# Patient Record
Sex: Male | Born: 1985 | Race: Black or African American | Hispanic: No | Marital: Single | Smoking: Never smoker
Health system: Southern US, Community
[De-identification: ages and names within clinical notes are randomized; demographics above are authoritative.]

## PROBLEM LIST (undated history)

## (undated) DIAGNOSIS — R569 Unspecified convulsions: Secondary | ICD-10-CM

## (undated) HISTORY — PX: DENTAL SURGERY: SHX609

---
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
## 2004-04-01 ENCOUNTER — Ambulatory Visit (HOSPITAL_COMMUNITY): Admission: RE | Admit: 2004-04-01 | Discharge: 2004-04-01 | Payer: Self-pay | Admitting: Pulmonary Disease

## 2005-09-19 ENCOUNTER — Ambulatory Visit (HOSPITAL_COMMUNITY): Admission: RE | Admit: 2005-09-19 | Discharge: 2005-09-19 | Payer: Self-pay | Admitting: Pulmonary Disease

## 2007-01-19 ENCOUNTER — Emergency Department (HOSPITAL_COMMUNITY): Admission: EM | Admit: 2007-01-19 | Discharge: 2007-01-19 | Payer: Self-pay | Admitting: Emergency Medicine

## 2008-01-26 ENCOUNTER — Emergency Department (HOSPITAL_COMMUNITY): Admission: EM | Admit: 2008-01-26 | Discharge: 2008-01-26 | Payer: Self-pay | Admitting: Emergency Medicine

## 2008-11-26 ENCOUNTER — Emergency Department (HOSPITAL_COMMUNITY): Admission: EM | Admit: 2008-11-26 | Discharge: 2008-11-26 | Payer: Self-pay | Admitting: Emergency Medicine

## 2008-12-18 ENCOUNTER — Emergency Department (HOSPITAL_COMMUNITY): Admission: EM | Admit: 2008-12-18 | Discharge: 2008-12-18 | Payer: Self-pay | Admitting: Emergency Medicine

## 2009-05-10 ENCOUNTER — Emergency Department (HOSPITAL_COMMUNITY): Admission: EM | Admit: 2009-05-10 | Discharge: 2009-05-10 | Payer: Self-pay | Admitting: Emergency Medicine

## 2009-05-30 ENCOUNTER — Emergency Department (HOSPITAL_COMMUNITY): Admission: EM | Admit: 2009-05-30 | Discharge: 2009-05-30 | Payer: Self-pay | Admitting: Emergency Medicine

## 2009-10-13 ENCOUNTER — Emergency Department (HOSPITAL_COMMUNITY): Admission: EM | Admit: 2009-10-13 | Discharge: 2009-10-13 | Payer: Self-pay | Admitting: Emergency Medicine

## 2009-11-21 ENCOUNTER — Emergency Department (HOSPITAL_COMMUNITY): Admission: EM | Admit: 2009-11-21 | Discharge: 2009-11-21 | Payer: Self-pay | Admitting: Emergency Medicine

## 2009-12-16 ENCOUNTER — Emergency Department (HOSPITAL_COMMUNITY): Admission: EM | Admit: 2009-12-16 | Discharge: 2009-12-16 | Payer: Self-pay | Admitting: Emergency Medicine

## 2010-01-01 ENCOUNTER — Emergency Department (HOSPITAL_COMMUNITY): Admission: EM | Admit: 2010-01-01 | Discharge: 2010-01-01 | Payer: Self-pay | Admitting: Emergency Medicine

## 2010-06-03 ENCOUNTER — Emergency Department (HOSPITAL_COMMUNITY): Admission: EM | Admit: 2010-06-03 | Discharge: 2010-06-03 | Payer: Self-pay | Admitting: Emergency Medicine

## 2010-09-17 ENCOUNTER — Emergency Department (HOSPITAL_COMMUNITY): Admission: EM | Admit: 2010-09-17 | Discharge: 2010-09-17 | Payer: Self-pay | Admitting: Emergency Medicine

## 2011-03-14 LAB — URINALYSIS, ROUTINE W REFLEX MICROSCOPIC
Glucose, UA: NEGATIVE mg/dL
Hgb urine dipstick: NEGATIVE
Ketones, ur: NEGATIVE mg/dL
pH: 6 (ref 5.0–8.0)

## 2011-03-14 LAB — COMPREHENSIVE METABOLIC PANEL
ALT: 23 U/L (ref 0–53)
Albumin: 4.4 g/dL (ref 3.5–5.2)
Alkaline Phosphatase: 101 U/L (ref 39–117)
Glucose, Bld: 96 mg/dL (ref 70–99)
Potassium: 3.8 mEq/L (ref 3.5–5.1)
Sodium: 138 mEq/L (ref 135–145)
Total Protein: 7.4 g/dL (ref 6.0–8.3)

## 2011-03-14 LAB — DIFFERENTIAL
Eosinophils Absolute: 0.1 10*3/uL (ref 0.0–0.7)
Eosinophils Relative: 1 % (ref 0–5)
Lymphs Abs: 0.8 10*3/uL (ref 0.7–4.0)
Monocytes Relative: 7 % (ref 3–12)

## 2011-03-14 LAB — CBC
MCHC: 33.4 g/dL (ref 30.0–36.0)
RBC: 5.07 MIL/uL (ref 4.22–5.81)
WBC: 8.2 10*3/uL (ref 4.0–10.5)

## 2011-03-14 LAB — RAPID URINE DRUG SCREEN, HOSP PERFORMED
Amphetamines: NOT DETECTED
Benzodiazepines: NOT DETECTED

## 2011-03-28 LAB — BASIC METABOLIC PANEL
BUN: 7 mg/dL (ref 6–23)
Calcium: 9.4 mg/dL (ref 8.4–10.5)
Chloride: 102 mEq/L (ref 96–112)
Creatinine, Ser: 1.02 mg/dL (ref 0.4–1.5)

## 2011-03-28 LAB — RAPID URINE DRUG SCREEN, HOSP PERFORMED
Opiates: NOT DETECTED
Tetrahydrocannabinol: NOT DETECTED

## 2011-03-28 LAB — GLUCOSE, CAPILLARY: Glucose-Capillary: 112 mg/dL — ABNORMAL HIGH (ref 70–99)

## 2011-07-30 ENCOUNTER — Encounter: Payer: Self-pay | Admitting: *Deleted

## 2011-07-30 ENCOUNTER — Emergency Department (HOSPITAL_COMMUNITY)
Admission: EM | Admit: 2011-07-30 | Discharge: 2011-07-30 | Disposition: A | Discharge status: Home / Self Care | Payer: Self-pay | Attending: Emergency Medicine | Admitting: Emergency Medicine

## 2011-07-30 DIAGNOSIS — H6690 Otitis media, unspecified, unspecified ear: Secondary | ICD-10-CM | POA: Insufficient documentation

## 2011-07-30 HISTORY — DX: Unspecified convulsions: R56.9

## 2011-07-30 MED ORDER — ANTIPYRINE-BENZOCAINE 5.4-1.4 % OT SOLN
3.0000 [drp] | Freq: Once | OTIC | Status: AC
  Administered 2011-07-30: 3 [drp] via OTIC
  Filled 2011-07-30 (×2): qty 10

## 2011-07-30 MED ORDER — IBUPROFEN 800 MG PO TABS
800.0000 mg | ORAL_TABLET | Freq: Once | ORAL | Status: AC
  Administered 2011-07-30: 800 mg via ORAL
  Filled 2011-07-30: qty 1

## 2011-07-30 MED ORDER — AMOXICILLIN 500 MG PO CAPS: 500.0000 mg | ORAL_CAPSULE | Freq: Three times a day (TID) | ORAL | Status: AC

## 2011-07-30 MED ORDER — AMOXICILLIN 250 MG PO CAPS
500.0000 mg | ORAL_CAPSULE | Freq: Three times a day (TID) | ORAL | Status: DC
  Administered 2011-07-30: 500 mg via ORAL
  Filled 2011-07-30: qty 2
  Filled 2011-07-30: qty 1

## 2011-07-30 NOTE — ED Notes (Signed)
Pt c/o right earache since Monday. No acute distress.

## 2011-07-30 NOTE — ED Notes (Signed)
Hyacinth Meeker, PA at bedside for patient evaluation.

## 2011-07-30 NOTE — ED Notes (Signed)
Patient with no complaints at this time. Respirations even and unlabored. Skin warm/dry. Discharge instructions reviewed with patient at this time. Patient given opportunity to voice concerns/ask questions. Patient discharged at this time and left Emergency Department with steady gait.   

## 2011-07-30 NOTE — ED Provider Notes (Signed)
History     CSN: [id number] Arrival date & time: 07/30/2011  1:20 PM  Chief Complaint  Patient presents with  . Otalgia   The history is provided by the patient (R earache since yest.). No language interpreter was used.    Past Medical History  Diagnosis Date  . Seizures     History reviewed. No pertinent past surgical history.  Family History  Problem Relation Age of Onset  . Hypertension Mother     History  Substance Use Topics  . Smoking status: Never Smoker   . Smokeless tobacco: Not on file  . Alcohol Use: No      Review of Systems  Constitutional: Negative for fever and activity change.  HENT: Positive for ear pain.   All other systems reviewed and are negative.    Physical Exam  BP 123/69  Pulse 63  Temp(Src) 98.1 F (36.7 C) (Oral)  Resp 20  SpO2 100%  Physical Exam  Nursing note and vitals reviewed. Constitutional: He is oriented to person, place, and time. Vital signs are normal. He appears well-developed and well-nourished. No distress.  HENT:  Head: Normocephalic and atraumatic.  Right Ear: External ear normal.  Left Ear: External ear normal.  Ears:  Nose: Nose normal.  Mouth/Throat: No oropharyngeal exudate.  Eyes: Conjunctivae and EOM are normal. Pupils are equal, round, and reactive to light. Right eye exhibits no discharge. Left eye exhibits no discharge. No scleral icterus.  Neck: Normal range of motion. Neck supple. No JVD present. No tracheal deviation present. No thyromegaly present.  Cardiovascular: Normal rate, regular rhythm, normal heart sounds, intact distal pulses and normal pulses.  Exam reveals no gallop and no friction rub.   No murmur heard. Pulmonary/Chest: Effort normal and breath sounds normal. No stridor. No respiratory distress. He has no wheezes. He has no rales. He exhibits no tenderness.  Abdominal: Soft. Normal appearance and bowel sounds are normal. He exhibits no distension and no mass. There is no tenderness.  There is no rebound and no guarding.  Musculoskeletal: Normal range of motion. He exhibits no edema and no tenderness.  Lymphadenopathy:    He has no cervical adenopathy.  Neurological: He is alert and oriented to person, place, and time. He has normal reflexes. No cranial nerve deficit. Coordination normal. GCS eye subscore is 4. GCS verbal subscore is 5. GCS motor subscore is 6.  Reflex Scores:      Tricep reflexes are 2+ on the right side and 2+ on the left side.      Bicep reflexes are 2+ on the right side and 2+ on the left side.      Brachioradialis reflexes are 2+ on the right side and 2+ on the left side.      Patellar reflexes are 2+ on the right side and 2+ on the left side.      Achilles reflexes are 2+ on the right side and 2+ on the left side. Skin: Skin is warm and dry. No rash noted. He is not diaphoretic.  Psychiatric: He has a normal mood and affect. His speech is normal and behavior is normal. Judgment and thought content normal. Cognition and memory are normal.    ED Course  Procedures  MDM       Worthy Rancher, PA 07/30/11 1545  Worthy Rancher, PA 07/30/11 1546  Worthy Rancher, PA 07/30/11 1547  Worthy Rancher, PA 07/30/11 1548  Worthy Rancher, [place] 07/30/11 1550  Medical screening examination/treatment/procedure(s)  were performed by non-physician practitioner and as supervising physician I was immediately available for consultation/collaboration.   Sunnie Nielsen, MD 08/04/11 [phone]

## 2011-08-26 ENCOUNTER — Emergency Department (HOSPITAL_COMMUNITY): Payer: No Typology Code available for payment source

## 2011-08-26 ENCOUNTER — Encounter (HOSPITAL_COMMUNITY): Payer: Self-pay | Admitting: Emergency Medicine

## 2011-08-26 ENCOUNTER — Emergency Department (HOSPITAL_COMMUNITY)
Admission: EM | Admit: 2011-08-26 | Discharge: 2011-08-26 | Disposition: A | Discharge status: Home / Self Care | Payer: No Typology Code available for payment source | Attending: Emergency Medicine | Admitting: Emergency Medicine

## 2011-08-26 DIAGNOSIS — S46819A Strain of other muscles, fascia and tendons at shoulder and upper arm level, unspecified arm, initial encounter: Secondary | ICD-10-CM

## 2011-08-26 DIAGNOSIS — R569 Unspecified convulsions: Secondary | ICD-10-CM | POA: Insufficient documentation

## 2011-08-26 DIAGNOSIS — S43499A Other sprain of unspecified shoulder joint, initial encounter: Secondary | ICD-10-CM | POA: Insufficient documentation

## 2011-08-26 NOTE — ED Notes (Signed)
Pt taken to xray at this time.
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
## 2012-02-04 ENCOUNTER — Encounter (HOSPITAL_COMMUNITY): Payer: Self-pay

## 2012-02-04 ENCOUNTER — Emergency Department (HOSPITAL_COMMUNITY)
Admission: EM | Admit: 2012-02-04 | Discharge: 2012-02-04 | Disposition: A | Discharge status: Home / Self Care | Payer: Self-pay | Attending: Emergency Medicine | Admitting: Emergency Medicine

## 2012-02-04 DIAGNOSIS — W268XXA Contact with other sharp object(s), not elsewhere classified, initial encounter: Secondary | ICD-10-CM | POA: Insufficient documentation

## 2012-02-04 DIAGNOSIS — S51809A Unspecified open wound of unspecified forearm, initial encounter: Secondary | ICD-10-CM | POA: Insufficient documentation

## 2012-02-04 DIAGNOSIS — Z79899 Other long term (current) drug therapy: Secondary | ICD-10-CM | POA: Insufficient documentation

## 2012-02-04 DIAGNOSIS — IMO0002 Reserved for concepts with insufficient information to code with codable children: Secondary | ICD-10-CM

## 2012-02-04 NOTE — ED Provider Notes (Signed)
History     CSN: [id number]  Arrival date & time 02/04/12  0840   First MD Initiated Contact with Patient 02/04/12 [phone]      Chief Complaint  Patient presents with  . Extremity Laceration    (Consider location/radiation/quality/duration/timing/severity/associated sxs/prior treatment) Patient is a 26 y.o. male presenting with skin laceration. The history is provided by the patient.  Laceration  The incident occurred less than 1 hour ago. The laceration is located on the left arm. The laceration is 2 cm in size. Injury mechanism: He broke the top of his cologne bottle,  cutting his forearm on its sharp edge. The patient is experiencing no pain. He reports no foreign bodies present. His tetanus status is UTD.    Past Medical History  Diagnosis Date  . Seizures     History reviewed. No pertinent past surgical history.  Family History  Problem Relation Age of Onset  . Hypertension Mother     History  Substance Use Topics  . Smoking status: Never Smoker   . Smokeless tobacco: Not on file  . Alcohol Use: No      Review of Systems  Constitutional: Negative for fever.  HENT: Negative.   Eyes: Negative.   Respiratory: Negative.   Cardiovascular: Negative.   Gastrointestinal: Negative.   Genitourinary: Negative.   Musculoskeletal: Negative for joint swelling and arthralgias.  Skin: Positive for wound. Negative for rash.  Neurological: Negative for weakness and numbness.  Hematological: Negative.   Psychiatric/Behavioral: Negative.     Allergies  Review of patient's allergies indicates no known allergies.  Home Medications   Current Outpatient Rx  Name Route Sig Dispense Refill  . PHENOBARBITAL 100 MG PO TABS Oral Take 100 mg by mouth 2 (two) times daily.        BP 112/80  Pulse 62  Temp(Src) 97.9 F (36.6 C) (Oral)  Resp 18  Ht 6' (1.829 m)  Wt 140 lb (63.504 kg)  BMI 18.99 kg/m2  SpO2 100%  Physical Exam  Nursing note and vitals  reviewed. Constitutional: He is oriented to person, place, and time. He appears well-developed and well-nourished.  HENT:  Head: Normocephalic and atraumatic.  Eyes: Conjunctivae are normal.  Neck: Neck supple.  Cardiovascular: Normal rate and intact distal pulses.   Pulmonary/Chest: Effort normal.  Musculoskeletal: Normal range of motion.  Neurological: He is alert and oriented to person, place, and time.  Skin: Skin is warm and dry.       2 cm superficial,  Linear laceration left dorsal forearm,  hemostatic  Psychiatric: He has a normal mood and affect.    ED Course  LACERATION REPAIR Date/Time: 02/04/2012 9:11 AM Performed by: Kayton Ripp L Authorized by: Candis Musa Consent: Verbal consent obtained. Risks and benefits: risks, benefits and alternatives were discussed Consent given by: patient Patient understanding: patient states understanding of the procedure being performed Time out: Immediately prior to procedure a "time out" was called to verify the correct patient, procedure, equipment, support staff and site/side marked as required. Laceration length: 2 cm Tendon involvement: none Nerve involvement: none Vascular damage: no Preparation: Patient was prepped and draped in the usual sterile fashion. Irrigation solution: saline Irrigation method: betadine applied,  rinsed with saline. Amount of cleaning: standard Skin closure: glue Patient tolerance: Patient tolerated the procedure well with no immediate complications. Comments: Base of wound easily visualized with no foreign body present.   (including critical care time)  Labs Reviewed - No data to display No results found.  1. Laceration       MDM          Candis Musa, PA 02/04/12 [phone]

## 2012-02-04 NOTE — ED Provider Notes (Signed)
Medical screening examination/treatment/procedure(s) were performed by non-physician practitioner and as supervising physician I was immediately available for consultation/collaboration.   [name], MD 02/04/12 1023

## 2012-02-04 NOTE — ED Notes (Signed)
Laceration to left forearm 

## 2012-02-04 NOTE — ED Notes (Signed)
Patient with no complaints at this time. Respirations even and unlabored. Skin warm/dry. Discharge instructions reviewed with patient at this time. Patient given opportunity to voice concerns/ask questions. Patient discharged at this time and left Emergency Department with steady gait.   

## 2012-08-21 ENCOUNTER — Other Ambulatory Visit: Payer: Self-pay

## 2012-08-21 ENCOUNTER — Encounter (HOSPITAL_COMMUNITY): Payer: Self-pay

## 2012-08-21 ENCOUNTER — Emergency Department (HOSPITAL_COMMUNITY)
Admission: EM | Admit: 2012-08-21 | Discharge: 2012-08-21 | Disposition: A | Discharge status: Home / Self Care | Payer: No Typology Code available for payment source | Attending: Emergency Medicine | Admitting: Emergency Medicine

## 2012-08-21 DIAGNOSIS — R569 Unspecified convulsions: Secondary | ICD-10-CM

## 2012-08-21 DIAGNOSIS — Y92410 Unspecified street and highway as the place of occurrence of the external cause: Secondary | ICD-10-CM | POA: Insufficient documentation

## 2012-08-21 LAB — CBC WITH DIFFERENTIAL/PLATELET
Basophils Relative: 1 % (ref 0–1)
Eosinophils Absolute: 0.1 10*3/uL (ref 0.0–0.7)
Eosinophils Relative: 2 % (ref 0–5)
Lymphs Abs: 3.1 10*3/uL (ref 0.7–4.0)
MCH: 27.4 pg (ref 26.0–34.0)
MCHC: 34.7 g/dL (ref 30.0–36.0)
MCV: 79 fL (ref 78.0–100.0)
Monocytes Relative: 8 % (ref 3–12)
Platelets: 242 10*3/uL (ref 150–400)
RBC: 5.25 MIL/uL (ref 4.22–5.81)

## 2012-08-21 LAB — BASIC METABOLIC PANEL
BUN: 10 mg/dL (ref 6–23)
Calcium: 9.5 mg/dL (ref 8.4–10.5)
GFR calc non Af Amer: 87 mL/min — ABNORMAL LOW (ref >90)
Glucose, Bld: 119 mg/dL — ABNORMAL HIGH (ref 70–99)

## 2012-08-21 MED ORDER — PHENOBARBITAL 20 MG/5ML PO ELIX
ORAL_SOLUTION | ORAL | Status: AC
  Administered 2012-08-21: 100 mg via ORAL
  Filled 2012-08-21: qty 25

## 2012-08-21 MED ORDER — PHENOBARBITAL 100 MG PO TABS
100.0000 mg | ORAL_TABLET | Freq: Once | ORAL | Status: DC
  Filled 2012-08-21: qty 1

## 2012-08-21 MED ORDER — PHENOBARBITAL 20 MG/5ML PO ELIX
100.0000 mg | ORAL_SOLUTION | Freq: Once | ORAL | Status: AC
  Administered 2012-08-21: 100 mg via ORAL

## 2012-08-21 NOTE — ED Notes (Signed)
Reviewed D/C instructions with patient. Pt verbalized understanding.
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
## 2012-10-16 ENCOUNTER — Encounter (HOSPITAL_COMMUNITY): Payer: Self-pay | Admitting: *Deleted

## 2012-10-16 ENCOUNTER — Emergency Department (HOSPITAL_COMMUNITY)
Admission: EM | Admit: 2012-10-16 | Discharge: 2012-10-16 | Disposition: A | Discharge status: Home / Self Care | Payer: Self-pay | Attending: Emergency Medicine | Admitting: Emergency Medicine

## 2012-10-16 DIAGNOSIS — G40909 Epilepsy, unspecified, not intractable, without status epilepticus: Secondary | ICD-10-CM | POA: Insufficient documentation

## 2012-10-16 DIAGNOSIS — R569 Unspecified convulsions: Secondary | ICD-10-CM

## 2012-10-16 DIAGNOSIS — Z79899 Other long term (current) drug therapy: Secondary | ICD-10-CM | POA: Insufficient documentation

## 2012-10-16 LAB — PHENOBARBITAL LEVEL: Phenobarbital: <2.4 ug/mL — ABNORMAL LOW (ref 15.0–40.0)

## 2012-10-16 MED ORDER — PHENOBARBITAL 32.4 MG PO TABS
100.0000 mg | ORAL_TABLET | Freq: Once | ORAL | Status: AC
  Administered 2012-10-16: 97.2 mg via ORAL
  Filled 2012-10-16: qty 3

## 2012-10-16 MED ORDER — LORAZEPAM 2 MG/ML IJ SOLN
1.0000 mg | Freq: Once | INTRAMUSCULAR | Status: AC
  Administered 2012-10-16: 1 mg via INTRAVENOUS
  Filled 2012-10-16: qty 1

## 2012-10-16 MED ORDER — PHENOBARBITAL 100 MG PO TABS: 100.0000 mg | ORAL_TABLET | Freq: Two times a day (BID) | ORAL | Status: DC
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
## 2012-10-16 NOTE — ED Notes (Signed)
Pt arrived from home via ems d/t seizure activity. Pt was getting ready for work this am and had witnessed seizure by his mother. Mother told ems pt had seizure about a month ago.

## 2012-10-16 NOTE — ED Notes (Signed)
Called pharmacy, they to bring med up

## 2012-10-16 NOTE — ED Notes (Signed)
Offered pt a breakfast tray.  Pt is eating.  nad noted

## 2012-10-29 ENCOUNTER — Other Ambulatory Visit: Payer: Self-pay | Admitting: Neurology

## 2012-10-29 DIAGNOSIS — G40909 Epilepsy, unspecified, not intractable, without status epilepticus: Secondary | ICD-10-CM

## 2012-10-29 DIAGNOSIS — G35D Multiple sclerosis, unspecified: Secondary | ICD-10-CM

## 2012-10-29 DIAGNOSIS — G35 Multiple sclerosis: Secondary | ICD-10-CM

## 2012-10-30 ENCOUNTER — Other Ambulatory Visit: Payer: Self-pay

## 2012-10-30 DIAGNOSIS — R569 Unspecified convulsions: Secondary | ICD-10-CM

## 2012-11-05 ENCOUNTER — Ambulatory Visit (HOSPITAL_COMMUNITY)
Admission: RE | Admit: 2012-11-05 | Discharge: 2012-11-05 | Disposition: A | Discharge status: Home / Self Care | Payer: Self-pay | Source: Ambulatory Visit | Attending: Neurology | Admitting: Neurology

## 2012-11-05 DIAGNOSIS — Z1389 Encounter for screening for other disorder: Secondary | ICD-10-CM | POA: Insufficient documentation

## 2012-11-05 DIAGNOSIS — R569 Unspecified convulsions: Secondary | ICD-10-CM | POA: Insufficient documentation

## 2012-11-05 NOTE — Progress Notes (Signed)
EEG COMPLETED

## 2012-11-09 ENCOUNTER — Ambulatory Visit (HOSPITAL_COMMUNITY): Payer: No Typology Code available for payment source

## 2012-11-09 ENCOUNTER — Ambulatory Visit (HOSPITAL_COMMUNITY)
Admission: RE | Admit: 2012-11-09 | Discharge: 2012-11-09 | Disposition: A | Discharge status: Home / Self Care | Payer: Self-pay | Source: Ambulatory Visit | Attending: Neurology | Admitting: Neurology

## 2012-11-09 DIAGNOSIS — R51 Headache: Secondary | ICD-10-CM | POA: Insufficient documentation

## 2012-11-09 DIAGNOSIS — G40909 Epilepsy, unspecified, not intractable, without status epilepticus: Secondary | ICD-10-CM

## 2012-11-09 DIAGNOSIS — R569 Unspecified convulsions: Secondary | ICD-10-CM | POA: Insufficient documentation

## 2012-11-09 DIAGNOSIS — G35 Multiple sclerosis: Secondary | ICD-10-CM

## 2012-11-09 MED ORDER — GADOBENATE DIMEGLUMINE 529 MG/ML IV SOLN
13.0000 mL | Freq: Once | INTRAVENOUS | Status: AC | PRN
  Administered 2012-11-09: 13 mL via INTRAVENOUS

## 2012-11-09 NOTE — Procedures (Signed)
  [facility] Sherly Brodbeck A. Gerilyn Pilgrim, MD     [email or web address]         NAMEJARMAN, LITTON            ACCOUNT NO.:  [id number]  MEDICAL RECORD NO.:  [id number]  LOCATION:  EE                           FACILITY:  [facility]  PHYSICIAN:  Jaima Janney A. Gerilyn Pilgrim, M.D. DATE OF BIRTH:  17-Jun-1986  DATE OF PROCEDURE:  11/05/2012 DATE OF DISCHARGE:  11/05/2012                             EEG INTERPRETATION   REFERRING PHYSICIAN:  Tesha Archambeau A. Gerilyn Pilgrim, M.D.  INDICATION:  This is a 26 year old who presents with new onset seizures.  MEDICATIONS:  Phenytoin and Keppra.  ANALYSIS:  A 16-channel recording using standard 10/20 measurements is conducted for 23 minutes.  There is a well-formed posterior dominant rhythm of 9 Hz, which attenuates with eye opening.  There is a beta activity observed in the frontal areas.  Awake and sleep activities are recorded.  Brief amount of spindles are seen.  Photic stimulation and hyperventilation of above carried out without abnormal changes in the background activity.  There is no focal or lateralized slowing.  There is no epileptiform activity.  IMPRESSION:  This is a normal recording in the awake and sleep states. A single recording does not rule out epileptic seizures.  If clinically indicated, a sleep-deprived or prolonged recording may be useful.     Baelyn Doring A. Gerilyn Pilgrim, M.D.     KAD/MEDQ  D:  11/09/2012  T:  11/09/2012  Job:  [id number]

## 2012-11-13 ENCOUNTER — Ambulatory Visit (HOSPITAL_COMMUNITY): Payer: Self-pay

## 2012-12-12 ENCOUNTER — Emergency Department (HOSPITAL_COMMUNITY)
Admission: EM | Admit: 2012-12-12 | Discharge: 2012-12-12 | Disposition: A | Discharge status: Home / Self Care | Payer: Self-pay | Attending: Emergency Medicine | Admitting: Emergency Medicine

## 2012-12-12 ENCOUNTER — Encounter (HOSPITAL_COMMUNITY): Payer: Self-pay | Admitting: Emergency Medicine

## 2012-12-12 DIAGNOSIS — G40909 Epilepsy, unspecified, not intractable, without status epilepticus: Secondary | ICD-10-CM | POA: Insufficient documentation

## 2012-12-12 LAB — BASIC METABOLIC PANEL WITH GFR
BUN: 9 mg/dL (ref 6–23)
CO2: 29 meq/L (ref 19–32)
Calcium: 9.1 mg/dL (ref 8.4–10.5)
Chloride: 98 meq/L (ref 96–112)
Creatinine, Ser: 1.1 mg/dL (ref 0.50–1.35)
GFR calc Af Amer: >90 mL/min
GFR calc non Af Amer: >90 mL/min
Glucose, Bld: 94 mg/dL (ref 70–99)
Potassium: 3.2 meq/L — ABNORMAL LOW (ref 3.5–5.1)
Sodium: 136 meq/L (ref 135–145)

## 2012-12-12 LAB — CBC WITH DIFFERENTIAL/PLATELET
Basophils Absolute: 0 K/uL (ref 0.0–0.1)
Basophils Relative: 1 % (ref 0–1)
Eosinophils Absolute: 0.1 K/uL (ref 0.0–0.7)
Eosinophils Relative: 2 % (ref 0–5)
HCT: 39.9 % (ref 39.0–52.0)
Hemoglobin: 13.8 g/dL (ref 13.0–17.0)
Lymphocytes Relative: 57 % — ABNORMAL HIGH (ref 12–46)
Lymphs Abs: 1.3 K/uL (ref 0.7–4.0)
MCH: 27.8 pg (ref 26.0–34.0)
MCHC: 34.6 g/dL (ref 30.0–36.0)
MCV: 80.4 fL (ref 78.0–100.0)
Monocytes Absolute: 0.3 K/uL (ref 0.1–1.0)
Monocytes Relative: 10 % (ref 3–12)
Neutro Abs: 0.8 K/uL — ABNORMAL LOW (ref 1.7–7.7)
Neutrophils Relative %: 30 % — ABNORMAL LOW (ref 43–77)
Platelets: 208 K/uL (ref 150–400)
RBC: 4.96 MIL/uL (ref 4.22–5.81)
RDW: 13.4 % (ref 11.5–15.5)
WBC: 2.5 K/uL — ABNORMAL LOW (ref 4.0–10.5)

## 2012-12-12 LAB — PHENOBARBITAL LEVEL: Phenobarbital: <2.4 ug/mL — ABNORMAL LOW (ref 15.0–40.0)

## 2012-12-12 MED ORDER — LORAZEPAM 1 MG PO TABS
1.0000 mg | ORAL_TABLET | Freq: Once | ORAL | Status: AC
  Administered 2012-12-12: 1 mg via ORAL
  Filled 2012-12-12: qty 1

## 2012-12-12 MED ORDER — POTASSIUM CHLORIDE CRYS ER 20 MEQ PO TBCR
20.0000 meq | EXTENDED_RELEASE_TABLET | Freq: Once | ORAL | Status: AC
  Administered 2012-12-12: 20 meq via ORAL
  Filled 2012-12-12: qty 1

## 2012-12-12 NOTE — ED Provider Notes (Signed)
History     CSN: [id number]  Arrival date & time 12/12/12  2013   First MD Initiated Contact with Patient 12/12/12 2019      Chief Complaint  Patient presents with  . Seizures    (Consider location/radiation/quality/duration/timing/severity/associated sxs/prior treatment) HPI Comments: Dan Mathews was walking in a parking lot prior to arrival when he had a seizure.  His mother promptly caught him and helped him to the ground so avoided injury.  The seizure was preceded by a brief loud roaring in his ears which is typical of his seizure disorder.  The episode lasted for about a minute,  Then he was confused for about 5 minutes after the event.  He is now awake, alert and in no distress.  He is followed by Dr. Gerilyn Pilgrim and was switched from dilantin to phenobarbital about 2 months ago.  His last seizure occurred about 6 weeks ago.  He had no urinary incontinence.  The history is provided by the patient and a parent.    Past Medical History  Diagnosis Date  . Seizures     History reviewed. No pertinent past surgical history.  Family History  Problem Relation Age of Onset  . Hypertension Mother     History  Substance Use Topics  . Smoking status: Never Smoker   . Smokeless tobacco: Not on file  . Alcohol Use: No      Review of Systems  Constitutional: Negative for fever.  HENT: Negative for congestion, sore throat and neck pain.   Eyes: Negative.   Respiratory: Negative for chest tightness and shortness of breath.   Cardiovascular: Negative for chest pain.  Gastrointestinal: Negative for nausea and abdominal pain.  Genitourinary: Negative.   Musculoskeletal: Negative for joint swelling and arthralgias.  Skin: Negative.  Negative for rash and wound.  Neurological: Positive for seizures. Negative for dizziness, weakness, light-headedness, numbness and headaches.  Hematological: Negative.   Psychiatric/Behavioral: Negative.     Allergies  Review of patient's  allergies indicates no known allergies.  Home Medications   Current Outpatient Rx  Name  Route  Sig  Dispense  Refill  . PHENOBARBITAL 100 MG PO TABS   Oral   Take 1 tablet (100 mg total) by mouth 2 (two) times daily.   60 tablet   3     BP 114/68  Pulse 71  Temp 98.7 F (37.1 C) (Oral)  Resp 20  Ht 6' (1.829 m)  Wt 140 lb (63.504 kg)  BMI 18.99 kg/m2  SpO2 100%  Physical Exam  Nursing note and vitals reviewed. Constitutional: He is oriented to person, place, and time. He appears well-developed and well-nourished.  HENT:  Head: Normocephalic and atraumatic.  Eyes: Conjunctivae normal are normal.  Neck: Normal range of motion.  Cardiovascular: Normal rate, regular rhythm, normal heart sounds and intact distal pulses.   Pulmonary/Chest: Effort normal and breath sounds normal. He has no wheezes.  Abdominal: Soft. Bowel sounds are normal. There is no tenderness.  Musculoskeletal: Normal range of motion.  Neurological: He is alert and oriented to person, place, and time. He displays normal reflexes. No cranial nerve deficit. Coordination and gait normal.  Skin: Skin is warm and dry.  Psychiatric: He has a normal mood and affect.    ED Course  Procedures (including critical care time)   Labs Reviewed  PHENOBARBITAL LEVEL  BASIC METABOLIC PANEL  CBC WITH DIFFERENTIAL   No results found.   No diagnosis found.  After phenobarbitol level drawn and  was being run, pt informed that he is mistaken,  He no longer takes phenobarbitol,  He was switched to keppra several months ago.  MDM  Pt was given ativan 1 mg PO,  He had no further seizure activity while in ed.  He was ambulatory in dept without problems.  Advised pt inform his neurologist of this seizure - he does have a ov with him the first week of Jan.  Caution given re driving,  Using dangerous machinery, etc.        Burgess Amor, PA 12/13/12 1139  Burgess Amor, PA 12/13/12 1140

## 2012-12-12 NOTE — ED Notes (Addendum)
Pt prtesents via EMS from a seizure. States he was walking in the grocery store when it happened. Pts mother caught patient and helped him to the ground, Denies injuries. Pt is AAx4 at this time. States the last time he had a seizure was 2-3 months ago. Takes keppra 500mg  2x a day.

## 2012-12-12 NOTE — ED Notes (Addendum)
Pt states he takes Keppra and not dilantin. Takes 500mg  BID, July Idol PA notified

## 2012-12-12 NOTE — ED Notes (Addendum)
Pt placed in a room and on monitor and o2, Placed in gown, NAD noted, AAx4 and resting at this time

## 2012-12-13 NOTE — ED Provider Notes (Signed)
Medical screening examination/treatment/procedure(s) were performed by non-physician practitioner and as supervising physician I was immediately available for consultation/collaboration.   Fleetwood Pierron III, MD 12/13/12 1326 

## 2013-01-06 ENCOUNTER — Emergency Department (HOSPITAL_COMMUNITY): Payer: Self-pay

## 2013-01-06 ENCOUNTER — Encounter (HOSPITAL_COMMUNITY): Payer: Self-pay

## 2013-01-06 ENCOUNTER — Emergency Department (HOSPITAL_COMMUNITY)
Admission: EM | Admit: 2013-01-06 | Discharge: 2013-01-06 | Disposition: A | Discharge status: Home / Self Care | Payer: Self-pay | Attending: Emergency Medicine | Admitting: Emergency Medicine

## 2013-01-06 DIAGNOSIS — Y9229 Other specified public building as the place of occurrence of the external cause: Secondary | ICD-10-CM | POA: Insufficient documentation

## 2013-01-06 DIAGNOSIS — R569 Unspecified convulsions: Secondary | ICD-10-CM

## 2013-01-06 DIAGNOSIS — S0003XA Contusion of scalp, initial encounter: Secondary | ICD-10-CM | POA: Insufficient documentation

## 2013-01-06 DIAGNOSIS — Z79899 Other long term (current) drug therapy: Secondary | ICD-10-CM | POA: Insufficient documentation

## 2013-01-06 DIAGNOSIS — G40309 Generalized idiopathic epilepsy and epileptic syndromes, not intractable, without status epilepticus: Secondary | ICD-10-CM | POA: Insufficient documentation

## 2013-01-06 DIAGNOSIS — S0083XA Contusion of other part of head, initial encounter: Secondary | ICD-10-CM | POA: Insufficient documentation

## 2013-01-06 DIAGNOSIS — Y9389 Activity, other specified: Secondary | ICD-10-CM | POA: Insufficient documentation

## 2013-01-06 DIAGNOSIS — W1809XA Striking against other object with subsequent fall, initial encounter: Secondary | ICD-10-CM | POA: Insufficient documentation

## 2013-01-06 LAB — POCT I-STAT, CHEM 8
Creatinine, Ser: 0.9 mg/dL (ref 0.50–1.35)
Glucose, Bld: 79 mg/dL (ref 70–99)
HCT: 50 % (ref 39.0–52.0)
Hemoglobin: 17 g/dL (ref 13.0–17.0)
Potassium: 4 mEq/L (ref 3.5–5.1)
Sodium: 140 mEq/L (ref 135–145)
TCO2: 24 mmol/L (ref 0–100)

## 2013-01-06 MED ORDER — FLUORESCEIN SODIUM 1 MG OP STRP
ORAL_STRIP | OPHTHALMIC | Status: AC
  Filled 2013-01-06: qty 2

## 2013-01-06 MED ORDER — TETRACAINE HCL 0.5 % OP SOLN
OPHTHALMIC | Status: AC
  Filled 2013-01-06: qty 2

## 2013-01-06 NOTE — ED Notes (Signed)
Pt is not on Dilantin, Pt states he has been on Keppra for 2 months, EDP aware

## 2013-01-06 NOTE — ED Provider Notes (Signed)
History     CSN: [id number]  Arrival date & time 01/06/13  1637   First MD Initiated Contact with Patient 01/06/13 1638      Chief Complaint  Patient presents with  . 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  Tonic clonic activity lasted about one minute per ems.  Patient post ictal when ems arrived but clearing en route.  Patient with known history of seizures reports on dilantin and taking as prescribed.  Patient hit head on concrete but denies head pain or other pain.  Denies fever, missed dose, sleep deprivation or any other unusual circumstances causing seizure.   Past Medical History  Diagnosis Date  . Seizures     History reviewed. No pertinent past surgical history.  Family History  Problem Relation Age of Onset  . Hypertension Mother     History  Substance Use Topics  . Smoking status: Never Smoker   . Smokeless tobacco: Not on file  . Alcohol Use: No      Review of Systems  All other systems reviewed and are negative.    Allergies  Review of patient's allergies indicates no known allergies.  Home Medications   Current Outpatient Rx  Name  Route  Sig  Dispense  Refill  . LEVETIRACETAM 500 MG PO TABS   Oral   Take 500 mg by mouth 2 (two) times daily.           BP 106/74  Pulse 68  Temp 97.6 F (36.4 C) (Oral)  Resp 18  SpO2 100%  Physical Exam  Nursing note and vitals reviewed. Constitutional: He is oriented to person, place, and time. He appears well-developed and well-nourished.  HENT:  Head: Normocephalic.  Right Ear: External ear normal.  Left Ear: External ear normal.  Nose: Nose normal.  Mouth/Throat: Oropharynx is clear and moist.       Contusion/abrasion left occiput.    Eyes: Conjunctivae normal and EOM are normal. Pupils are equal, round, and reactive to light.  Neck: Normal range  of motion. Neck supple.  Cardiovascular: Normal rate, regular rhythm, normal heart sounds and intact distal pulses.   Pulmonary/Chest: Effort normal and breath sounds normal. No respiratory distress. He has no wheezes. He exhibits no tenderness.  Abdominal: Soft. Bowel sounds are normal. He exhibits no distension and no mass. There is no tenderness. There is no guarding.  Musculoskeletal: Normal range of motion.       Spine nontender  Neurological: He is alert and oriented to person, place, and time. He has normal reflexes. He exhibits normal muscle tone. Coordination normal.  Skin: Skin is warm and dry.  Psychiatric: He has a normal mood and affect. His behavior is normal. Judgment and thought content normal.    ED Course  Procedures (including critical care time)  Labs Reviewed  PHENYTOIN LEVEL, TOTAL - Abnormal; Notable for the following:    Phenytoin Lvl <2.5 (*)  REPEATED TO VERIFY   All other components within normal limits  POCT I-STAT, CHEM 8   Ct Head Wo Contrast  01/06/2013  *RADIOLOGY REPORT*  Clinical Data:   Contusion   Seizure after fall  CT HEAD WITHOUT CONTRAST  Technique:  Contiguous axial images were obtained from the base of the skull through the vertex without contrast.  Comparison: 12/16/2009 the  Findings: A similar appearance of areas of hypoattenuation within the subcortical periventricular white matter.  This was previously described as representing diffusely prominent perivascular spaces. The ventricular volumes and sulci appear normal.  Midline is maintained. There is no evidence for acute brain infarct, hemorrhage or mass.  Mucosal thickening involving the sphenoid sinus noted.  Mastoid air cells appear clear.  The skull is intact.  IMPRESSION:  1.  No acute intracranial abnormalities. 2.  Similar appearance of nonspecific subcortical periventricular white matter hypodensities. Previously described as representing prominent perivascular spaces.   Original Report  Authenticated By: Signa Kell, M.D.      No diagnosis found.    MDM  Patient taking keppra not dilantin.  Ct head without acute abnormalities and patient with improved clearing of post ictal confusion Family at bedside.        Hilario Quarry, MD 01/06/13 [phone]

## 2013-01-06 NOTE — ED Notes (Signed)
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  Event lasted apporx 1 min.

## 2013-01-06 NOTE — ED Notes (Signed)
Discharge instructions reviewed with pt, questions answered. Pt verbalized understanding.  

## 2013-02-10 ENCOUNTER — Emergency Department (HOSPITAL_COMMUNITY)
Admission: EM | Admit: 2013-02-10 | Discharge: 2013-02-10 | Disposition: A | Discharge status: Home / Self Care | Payer: Self-pay | Attending: Emergency Medicine | Admitting: Emergency Medicine

## 2013-02-10 ENCOUNTER — Encounter (HOSPITAL_COMMUNITY): Payer: Self-pay

## 2013-02-10 DIAGNOSIS — R569 Unspecified convulsions: Secondary | ICD-10-CM

## 2013-02-10 DIAGNOSIS — G40909 Epilepsy, unspecified, not intractable, without status epilepticus: Secondary | ICD-10-CM | POA: Insufficient documentation

## 2013-02-10 DIAGNOSIS — Z79899 Other long term (current) drug therapy: Secondary | ICD-10-CM | POA: Insufficient documentation

## 2013-02-10 MED ORDER — LEVETIRACETAM 500 MG PO TABS
500.0000 mg | ORAL_TABLET | Freq: Once | ORAL | Status: AC
  Administered 2013-02-10: 500 mg via ORAL
  Filled 2013-02-10: qty 1

## 2013-02-10 MED ORDER — LEVETIRACETAM 1000 MG PO TABS: 1000.0000 mg | ORAL_TABLET | Freq: Two times a day (BID) | ORAL | Status: DC

## 2013-02-10 NOTE — ED Notes (Signed)
Pt did not fall or hit his head per [facility]

## 2013-02-10 NOTE — ED Provider Notes (Signed)
History    This chart was scribed for Ward Givens, MD by Melba Coon, ED Scribe. The patient was seen in room APA02/APA02 and the patient's care was started at 11:57AM.    CSN: [id number]  Arrival date & time 02/10/13  1100   First MD Initiated Contact with Patient 02/10/13 1142      Chief Complaint  Patient presents with  . Seizures    (Consider location/radiation/quality/duration/timing/severity/associated sxs/prior treatment) The history is provided by the patient. No language interpreter was used.   Dan Mathews is a 27 y.o. male who presents to the Emergency Department complaining of a witnessed seizure and fall without head contact with an onset about an hour ago. He has a chronic history of seizures since he was in fourth grade He currently takes Keppra 500 mg bid and takes his medication with good compliance. His mother states he had been well controlled on Dilantin for many years however in November he started having increasing seizures and he was switched to the Keppra. He was here at [facility] on the third floor visiting his uncle when he started to seize. Description of the seizure was not obtained from the staff upstairs. Last seizure was in the last month. He currently reports headaches but sustained no other injuries from the fall. Denies fever, neck pain, sore throat, rash, back pain, CP, SOB, abdominal pain, nausea, emesis, diarrhea, dysuria, or extremity pain, edema, weakness, numbness, or tingling. No known allergies. No other pertinent medical symptoms.  Patient and wife both state he does not drive and he knows he has to be seizure-free for 6 months before he can start driving.  Patient states he was seen at his neurologist office on February 10.  PCP: Dr. Juanetta Gosling Neurologist: Dr. Marjory Sneddon; last saw him last week  Past Medical History  Diagnosis Date  . Seizures     History reviewed. No pertinent past surgical history.  Family History   Problem Relation Age of Onset  . Hypertension Mother     History  Substance Use Topics  . Smoking status: Never Smoker   . Smokeless tobacco: Not on file  . Alcohol Use: No  Lives with his mother Employed   Review of Systems  Neurological: Positive for seizures.  10 Systems reviewed and all are negative for acute change except as noted in the HPI.    Allergies  Review of patient's allergies indicates no known allergies.  Home Medications   Current Outpatient Rx  Name  Route  Sig  Dispense  Refill  . levETIRAcetam (KEPPRA) 500 MG tablet   Oral   Take 500 mg by mouth 2 (two) times daily.           BP 128/65  Pulse 66  Temp(Src) 98.6 F (37 C) (Oral)  Resp 17  SpO2 98%  Vital signs normal    Physical Exam  Nursing note and vitals reviewed. Constitutional: He is oriented to person, place, and time. He appears well-developed and well-nourished.  Non-toxic appearance. He does not appear ill. No distress.  HENT:  Head: Normocephalic and atraumatic.  Right Ear: External ear normal.  Left Ear: External ear normal.  Nose: Nose normal. No mucosal edema or rhinorrhea.  Mouth/Throat: Oropharynx is clear and moist and mucous membranes are normal. No dental abscesses or edematous.    Bruising to his right tongue with a small amount of blood in his mouth.  Eyes: Conjunctivae and EOM are normal. Pupils are equal, round, and reactive  to light.  Neck: Normal range of motion and full passive range of motion without pain. Neck supple.  Cardiovascular: Normal rate, regular rhythm and normal heart sounds.  Exam reveals no gallop and no friction rub.   No murmur heard. Pulmonary/Chest: Effort normal and breath sounds normal. No respiratory distress. He has no wheezes. He has no rhonchi. He has no rales. He exhibits no tenderness and no crepitus.  Abdominal: Soft. Normal appearance and bowel sounds are normal. He exhibits no distension. There is no tenderness. There is no  rebound and no guarding.  Musculoskeletal: Normal range of motion. He exhibits no edema and no tenderness.  Moves all extremities well.   Neurological: He is alert and oriented to person, place, and time. He has normal strength. No cranial nerve deficit.  Skin: Skin is warm, dry and intact. No rash noted. No erythema. No pallor.  Psychiatric: He has a normal mood and affect. His speech is normal and behavior is normal. His mood appears not anxious.    ED Course  Procedures (including critical care time) Medications  levETIRAcetam (KEPPRA) tablet 500 mg (500 mg Oral Given 02/10/13 1338)     DIAGNOSTIC STUDIES: Oxygen Saturation is 100% on room air, normal by my interpretation.    COORDINATION OF CARE:  12:06PM - Consult to neurology will be ordered.   12:17 PM patient discussed with Dr. Gerilyn Pilgrim. He advises increasing the Keppra to 1000 mg twice a day and be rechecked in the office in the next 2 weeks  12:55PM - recheck; consult with neurology performed. Advised to increase keppra to 1000 mg bid and follow up in 2-3 weeks. He will be given Keppra 500 mg orally here today, he is art he taken his usual morning dose of 500 mg. He is to start the 1000 mg at his evening dose today. He is ready for d/c.   1. Seizure     Discharge Medication List as of 02/10/2013  1:08 PM    START taking these medications   Details  !! levETIRAcetam (KEPPRA) 1000 MG tablet Take 1 tablet (1,000 mg total) by mouth 2 (two) times daily., Starting 02/10/2013, Until Discontinued, Print       Plan discharge    MDM    I personally performed the services described in this documentation, which was scribed in my presence. The recorded information has been reviewed and considered.  Devoria Albe, MD, Armando Gang        Ward Givens, MD 02/10/13 [phone]

## 2013-02-10 NOTE — ED Notes (Signed)
Ice given to pt. 

## 2013-02-10 NOTE — ED Notes (Signed)
Per AC, Pt was on the third floor visiting and had a witnessed seizure.

## 2013-02-10 NOTE — ED Notes (Addendum)
Patient with no complaints at this time. Respirations even and unlabored. Skin warm/dry. Discharge instructions reviewed with patient at this time. Patient given opportunity to voice concerns/ask questions. Patient discharged at this time and left Emergency Department via wheelchair. 

## 2013-03-04 ENCOUNTER — Emergency Department (HOSPITAL_COMMUNITY)
Admission: EM | Admit: 2013-03-04 | Discharge: 2013-03-04 | Disposition: A | Discharge status: Home / Self Care | Payer: Self-pay | Attending: Emergency Medicine | Admitting: Emergency Medicine

## 2013-03-04 ENCOUNTER — Encounter (HOSPITAL_COMMUNITY): Payer: Self-pay

## 2013-03-04 DIAGNOSIS — G40909 Epilepsy, unspecified, not intractable, without status epilepticus: Secondary | ICD-10-CM | POA: Insufficient documentation

## 2013-03-04 DIAGNOSIS — Z79899 Other long term (current) drug therapy: Secondary | ICD-10-CM | POA: Insufficient documentation

## 2013-03-04 DIAGNOSIS — R51 Headache: Secondary | ICD-10-CM | POA: Insufficient documentation

## 2013-03-04 MED ORDER — LEVETIRACETAM 1000 MG PO TABS: 1500.0000 mg | ORAL_TABLET | Freq: Two times a day (BID) | ORAL | Status: DC

## 2013-03-04 NOTE — ED Notes (Signed)
Pt appears to be drowsy and sleepy, follows commands but slow to respond.

## 2013-03-04 NOTE — ED Notes (Signed)
Pt educated on prescription of keppra and follow up appointment at [facility] neurology

## 2013-03-04 NOTE — ED Provider Notes (Signed)
History     This chart was scribed for Dan B. Bernette Mayers, MD, MD by Smitty Pluck, ED Scribe. The patient was seen in room APA02/APA02 and the patient's care was started at 6:45 PM.   CSN: [id number]  Arrival date & time 03/04/13  1815      Chief Complaint  Patient presents with  . Seizures     The history is provided by the patient, medical records and the EMS personnel. No language interpreter was used.   Dan Mathews is a 27 y.o. male who presents to the Emergency Department BIB EMS complaining of seizure today. Pt is not post ictal. He reports that he had constant, mild headache. Pain is rated at 3/10. Pt does not remember what happened. Pt reports that prior to seizure he had normal day. Today the seizure was witnessed by grandmother but she is unsure of how long it lasted. He states he normally hears loud noise before onset of seizures. He denies biting his tongue, head injury and any other symptoms. Pt takes Keppra 1000 mg 2x/day that he started in January 2014. Pt states the has not missed any dose of Keppra. He states he has seizure 1x/month. Mom reports that pt had seizure in November and had seizures every week.   Neurologist is Dr. Gerilyn Pilgrim   Past Medical History  Diagnosis Date  . Seizures     History reviewed. No pertinent past surgical history.  Family History  Problem Relation Age of Onset  . Hypertension Mother     History  Substance Use Topics  . Smoking status: Never Smoker   . Smokeless tobacco: Not on file  . Alcohol Use: No      Review of Systems 10 Systems reviewed and all are negative for acute change except as noted in the HPI.   Allergies  Review of patient's allergies indicates no known allergies.  Home Medications   Current Outpatient Rx  Name  Route  Sig  Dispense  Refill  . levETIRAcetam (KEPPRA) 1000 MG tablet   Oral   Take 1 tablet (1,000 mg total) by mouth 2 (two) times daily.   30 tablet   0   . levETIRAcetam (KEPPRA)  500 MG tablet   Oral   Take 500 mg by mouth 2 (two) times daily.           BP 112/61  Pulse 73  Temp(Src) 98.3 F (36.8 C) (Oral)  Resp 18  Ht 6' (1.829 m)  Wt 140 lb (63.504 kg)  BMI 18.98 kg/m2  SpO2 100%  Physical Exam  Nursing note and vitals reviewed. Constitutional: He is oriented to person, place, and time. He appears well-developed and well-nourished.  HENT:  Head: Normocephalic and atraumatic.  Eyes: EOM are normal. Pupils are equal, round, and reactive to light.  Neck: Normal range of motion. Neck supple.  Cardiovascular: Normal rate, normal heart sounds and intact distal pulses.   Pulmonary/Chest: Effort normal and breath sounds normal.  Abdominal: Bowel sounds are normal. He exhibits no distension. There is no tenderness.  Musculoskeletal: Normal range of motion. He exhibits no edema and no tenderness.  Neurological: He is alert and oriented to person, place, and time. He has normal strength. No cranial nerve deficit or sensory deficit.  Skin: Skin is warm and dry. No rash noted.  Psychiatric: He has a normal mood and affect.    ED Course  Procedures (including critical care time) DIAGNOSTIC STUDIES: Oxygen Saturation is 100% on room air, normal  by my interpretation.    COORDINATION OF CARE: 6:50 PM Discussed ED treatment with pt and pt agrees.     Labs Reviewed - No data to display No results found.   1. Seizure       MDM  Pt with history of seizures has had occasional breakthrough seizures recently. Mother would like referral to Neurologist in [place]. Discussed with Dr Roseanne Reno who recommends increasing Keppra to 1500mg  BID.   I personally performed the services described in this documentation, which was scribed in my presence. The recorded information has been reviewed and is accurate.       Dan B. Bernette Mayers, MD 03/05/13 Jacinta Shoe

## 2013-03-04 NOTE — ED Notes (Signed)
Pt arrived to er via EMS due to seizure. Pt arrives to er in a post-ictal state and a headache 3/10. Pt was not able to recognize ems personal but usually he is able to as stated by ems. Witnessed seizure by grandmother lasting unknown amount of time. Pt is unable to recall last moments prior to seizure and can not remember if he fell upon seizure. When asked todays location and date he was a little confused. He knew he was at [facility] but thought it was April 11 instead of march 10. He was able to state current president.

## 2013-10-12 ENCOUNTER — Emergency Department (HOSPITAL_COMMUNITY)
Admission: EM | Admit: 2013-10-12 | Discharge: 2013-10-12 | Disposition: A | Discharge status: Home / Self Care | Payer: BC Managed Care – PPO | Attending: Emergency Medicine | Admitting: Emergency Medicine

## 2013-10-12 ENCOUNTER — Encounter (HOSPITAL_COMMUNITY): Payer: Self-pay | Admitting: Emergency Medicine

## 2013-10-12 DIAGNOSIS — J209 Acute bronchitis, unspecified: Secondary | ICD-10-CM | POA: Insufficient documentation

## 2013-10-12 DIAGNOSIS — G40909 Epilepsy, unspecified, not intractable, without status epilepticus: Secondary | ICD-10-CM | POA: Insufficient documentation

## 2013-10-12 DIAGNOSIS — J40 Bronchitis, not specified as acute or chronic: Secondary | ICD-10-CM

## 2013-10-12 DIAGNOSIS — Z79899 Other long term (current) drug therapy: Secondary | ICD-10-CM | POA: Insufficient documentation

## 2013-10-12 MED ORDER — AZITHROMYCIN 250 MG PO TABS: ORAL_TABLET | ORAL | Status: DC

## 2013-10-12 MED ORDER — PHENYLEPH-PROMETHAZINE-COD 5-6.25-10 MG/5ML PO SYRP: 5.0000 mL | ORAL_SOLUTION | ORAL | Status: DC | PRN

## 2013-10-12 MED ORDER — PSEUDOEPHEDRINE HCL 60 MG PO TABS: 60.0000 mg | ORAL_TABLET | Freq: Four times a day (QID) | ORAL | Status: DC | PRN

## 2013-10-12 NOTE — ED Provider Notes (Signed)
CSN: [id number]     Arrival date & time 10/12/13  1451 History   First MD Initiated Contact with Patient 10/12/13 1543     Chief Complaint  Patient presents with  . URI   (Consider location/radiation/quality/duration/timing/severity/associated sxs/prior Treatment) Patient is a 27 y.o. male presenting with URI. The history is provided by the patient.  URI Presenting symptoms: congestion, cough, facial pain, rhinorrhea and sore throat   Presenting symptoms: no fever   Severity:  Moderate Onset quality:  Gradual Duration:  2 days Timing:  Sporadic Progression:  Worsening Chronicity:  New Relieved by:  Nothing Worsened by:  Nothing tried Ineffective treatments:  Decongestant and OTC medications Associated symptoms: sinus pain and sneezing   Associated symptoms: no wheezing   Risk factors: no recent travel    Dan Mathews is a 27 y.o. male who presents to the ED with cough, cold, congestion and sinus pain for the past  2 days. He has not taken his temperature. He has been taking OCT medication without relief. Other family members have had similar symptoms.  Past Medical History  Diagnosis Date  . Seizures    History reviewed. No pertinent past surgical history. Family History  Problem Relation Age of Onset  . Hypertension Mother    History  Substance Use Topics  . Smoking status: Never Smoker   . Smokeless tobacco: Not on file  . Alcohol Use: No    Review of Systems  Constitutional: Negative for fever and chills.  HENT: Positive for congestion, rhinorrhea, sneezing and sore throat.   Respiratory: Positive for cough. Negative for wheezing.   Gastrointestinal: Negative for nausea, vomiting and abdominal pain.  Genitourinary: Negative for decreased urine volume.  Musculoskeletal: Negative for back pain and joint swelling.  Skin: Negative for rash.  Neurological: Negative for syncope.  Psychiatric/Behavioral: The patient is not nervous/anxious.     Allergies   Review of patient's allergies indicates no known allergies.  Home Medications   Current Outpatient Rx  Name  Route  Sig  Dispense  Refill  . levETIRAcetam (KEPPRA) 1000 MG tablet   Oral   Take 1,500 mg by mouth 2 (two) times daily.         Marland Kitchen azithromycin (ZITHROMAX Z-PAK) 250 MG tablet      Take 2 tablets PO today followed by one tablet daily for infection   6 each   0   . Phenyleph-Promethazine-Cod 5-6.25-10 MG/5ML SYRP   Oral   Take 5 mLs by mouth every 4 (four) hours as needed.   120 mL   0   . pseudoephedrine (SUDAFED) 60 MG tablet   Oral   Take 1 tablet (60 mg total) by mouth every 6 (six) hours as needed for congestion.   20 tablet   0    BP 103/60  Pulse 62  Temp(Src) 97.5 F (36.4 C) (Oral)  Resp 20  Ht 6' (1.829 m)  Wt 130 lb (58.968 kg)  BMI 17.63 kg/m2  SpO2 100% Physical Exam  Nursing note and vitals reviewed. Constitutional: He is oriented to person, place, and time. He appears well-developed and well-nourished. No distress.  HENT:  Head: Normocephalic.  Right Ear: Tympanic membrane normal.  Left Ear: Tympanic membrane normal.  Nose: Right sinus exhibits maxillary sinus tenderness. Left sinus exhibits maxillary sinus tenderness.  Eyes: EOM are normal.  Neck: Neck supple.  Cardiovascular: Normal rate, regular rhythm and normal heart sounds.   Pulmonary/Chest: Effort normal. He has no wheezes. He has no rales.  Abdominal: Soft. There is no tenderness.  Musculoskeletal: Normal range of motion.  Neurological: He is alert and oriented to person, place, and time. No cranial nerve deficit.  Skin: Skin is warm and dry.  Psychiatric: He has a normal mood and affect. His behavior is normal.    ED Course  Procedures  MDM  27 y.o. male with cough, cold and congestion x 2 days. Will treat symptoms and patient will follow up with PCP. He will return here as needed for any worsening symptoms.  1. Bronchitis   2. Sinus headache      Janne Napoleon,  NP 10/12/13 [phone]

## 2013-10-12 NOTE — ED Notes (Signed)
Pt reports cold s/s for 2 days.

## 2013-10-12 NOTE — ED Notes (Signed)
Pt presents with sinus pressure, headache, productive cough and nasal congestion x 2 days. Pt denies fever, N/V at this time.  No respiratory distress noted.

## 2013-10-12 NOTE — ED Provider Notes (Signed)
Medical screening examination/treatment/procedure(s) were performed by non-physician practitioner and as supervising physician I was immediately available for consultation/collaboration.  Arvle Grabe M Cailie Bosshart, MD 10/12/13 1706 

## 2014-03-09 ENCOUNTER — Encounter (HOSPITAL_COMMUNITY): Payer: Self-pay | Admitting: Emergency Medicine

## 2014-03-09 ENCOUNTER — Emergency Department (HOSPITAL_COMMUNITY)
Admission: EM | Admit: 2014-03-09 | Discharge: 2014-03-09 | Disposition: A | Discharge status: Home / Self Care | Payer: BC Managed Care – PPO | Attending: Emergency Medicine | Admitting: Emergency Medicine

## 2014-03-09 DIAGNOSIS — R55 Syncope and collapse: Secondary | ICD-10-CM | POA: Insufficient documentation

## 2014-03-09 DIAGNOSIS — Z79899 Other long term (current) drug therapy: Secondary | ICD-10-CM | POA: Insufficient documentation

## 2014-03-09 DIAGNOSIS — G40919 Epilepsy, unspecified, intractable, without status epilepticus: Secondary | ICD-10-CM

## 2014-03-09 DIAGNOSIS — G40909 Epilepsy, unspecified, not intractable, without status epilepticus: Secondary | ICD-10-CM | POA: Insufficient documentation

## 2014-03-09 LAB — COMPREHENSIVE METABOLIC PANEL
ALBUMIN: 4 g/dL (ref 3.5–5.2)
ALK PHOS: 68 U/L (ref 39–117)
ALT: 11 U/L (ref 0–53)
AST: 18 U/L (ref 0–37)
BILIRUBIN TOTAL: 0.7 mg/dL (ref 0.3–1.2)
BUN: 6 mg/dL (ref 6–23)
CHLORIDE: 102 meq/L (ref 96–112)
CO2: 28 mEq/L (ref 19–32)
Calcium: 9.4 mg/dL (ref 8.4–10.5)
Creatinine, Ser: 1 mg/dL (ref 0.50–1.35)
GFR calc Af Amer: >90 mL/min (ref >90)
GFR calc non Af Amer: >90 mL/min (ref >90)
Glucose, Bld: 99 mg/dL (ref 70–99)
POTASSIUM: 3.5 meq/L — AB (ref 3.7–5.3)
Sodium: 141 mEq/L (ref 137–147)
TOTAL PROTEIN: 6.8 g/dL (ref 6.0–8.3)

## 2014-03-09 LAB — CBC WITH DIFFERENTIAL/PLATELET
BASOS ABS: 0 10*3/uL (ref 0.0–0.1)
BASOS PCT: 1 % (ref 0–1)
Eosinophils Absolute: 0.2 10*3/uL (ref 0.0–0.7)
Eosinophils Relative: 5 % (ref 0–5)
HCT: 38.8 % — ABNORMAL LOW (ref 39.0–52.0)
HEMOGLOBIN: 13.3 g/dL (ref 13.0–17.0)
Lymphocytes Relative: 45 % (ref 12–46)
Lymphs Abs: 1.4 10*3/uL (ref 0.7–4.0)
MCH: 27.5 pg (ref 26.0–34.0)
MCHC: 34.3 g/dL (ref 30.0–36.0)
MCV: 80.3 fL (ref 78.0–100.0)
Monocytes Absolute: 0.3 10*3/uL (ref 0.1–1.0)
Monocytes Relative: 10 % (ref 3–12)
NEUTROS PCT: 39 % — AB (ref 43–77)
Neutro Abs: 1.2 10*3/uL — ABNORMAL LOW (ref 1.7–7.7)
Platelets: 212 10*3/uL (ref 150–400)
RBC: 4.83 MIL/uL (ref 4.22–5.81)
RDW: 13.3 % (ref 11.5–15.5)
WBC: 3.1 10*3/uL — ABNORMAL LOW (ref 4.0–10.5)

## 2014-03-09 NOTE — Discharge Instructions (Signed)
Do not drive or operate heavy equipment until you are seen and cleared by your neurologist. Return immediately for worsening seizure activity or for any concerns.   Driving and Equipment Restrictions Some medical problems make it dangerous to drive, ride a bike, or use machines. Some of these problems are:  A hard blow to the head (concussion).  Passing out (fainting).  Twitching and shaking (seizures).  Low blood sugar.  Taking medicine to help you relax (sedatives).  Taking pain medicines.  Wearing an eye patch.  Wearing splints. This can make it hard to use parts of your body that you need to drive safely. HOME CARE   Do not drive until your doctor says it is okay.  Do not use machines until your doctor says it is okay. You may need a form signed by your doctor (medical release) before you can drive again. You may also need this form before you do other tasks where you need to be fully alert. MAKE SURE YOU:  Understand these instructions.  Will watch your condition.  Will get help right away if you are not doing well or get worse. Document Released: 01/19/2005 Document Revised: 03/05/2012 Document Reviewed: 04/21/2010 [facility] Patient Information 2014 [place], [place].  Seizure, Adult A seizure is abnormal electrical activity in the brain. Seizures usually last from 30 seconds to 2 minutes. There are various types of seizures. Before a seizure, you may have a warning sensation (aura) that a seizure is about to occur. An aura may include the following symptoms:   Fear or anxiety.  Nausea.  Feeling like the room is spinning (vertigo).  Vision changes, such as seeing flashing lights or spots. Common symptoms during a seizure include:  A change in attention or behavior (altered mental status).  Convulsions with rhythmic jerking movements.  Drooling.  Rapid eye movements.  Grunting.  Loss of bladder and bowel control.  Bitter taste in the mouth.  Tongue  biting. After a seizure, you may feel confused and sleepy. You may also have an injury resulting from convulsions during the seizure. HOME CARE INSTRUCTIONS   If you are given medicines, take them exactly as prescribed by your health care provider.  Keep all follow-up appointments as directed by your health care provider.  Do not swim or drive or engage in risky activity during which a seizure could cause further injury to you or others until your health care provider says it is OK.  Get adequate rest.  Teach friends and family what to do if you have a seizure. They should:  Lay you on the ground to prevent a fall.  Put a cushion under your head.  Loosen any tight clothing around your neck.  Turn you on your side. If vomiting occurs, this helps keep your airway clear.  Stay with you until you recover.  Know whether or not you need emergency care. SEEK IMMEDIATE MEDICAL CARE IF:  The seizure lasts longer than 5 minutes.  The seizure is severe or you do not wake up immediately after the seizure.  You have an altered mental status after the seizure.  You are having more frequent or worsening seizures. Someone should drive you to the emergency department or call local emergency services (911 in [place] MAKE SURE YOU:  Understand these instructions.  Will watch your condition.  Will get help right away if you are not doing well or get worse. Document Released: 12/09/2000 Document Revised: 10/02/2013 Document Reviewed: 07/24/2013 [facility] Patient Information 2014 [place], [place].
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
## 2014-03-24 ENCOUNTER — Emergency Department (HOSPITAL_COMMUNITY)
Admission: EM | Admit: 2014-03-24 | Discharge: 2014-03-24 | Disposition: A | Discharge status: Home / Self Care | Payer: BC Managed Care – PPO | Attending: Emergency Medicine | Admitting: Emergency Medicine

## 2014-03-24 ENCOUNTER — Encounter (HOSPITAL_COMMUNITY): Payer: Self-pay | Admitting: Emergency Medicine

## 2014-03-24 DIAGNOSIS — H9319 Tinnitus, unspecified ear: Secondary | ICD-10-CM | POA: Insufficient documentation

## 2014-03-24 DIAGNOSIS — Z043 Encounter for examination and observation following other accident: Secondary | ICD-10-CM | POA: Insufficient documentation

## 2014-03-24 DIAGNOSIS — Z79899 Other long term (current) drug therapy: Secondary | ICD-10-CM | POA: Insufficient documentation

## 2014-03-24 DIAGNOSIS — R569 Unspecified convulsions: Secondary | ICD-10-CM

## 2014-03-24 DIAGNOSIS — Y92410 Unspecified street and highway as the place of occurrence of the external cause: Secondary | ICD-10-CM | POA: Insufficient documentation

## 2014-03-24 DIAGNOSIS — Y9389 Activity, other specified: Secondary | ICD-10-CM | POA: Insufficient documentation

## 2014-03-24 DIAGNOSIS — G40909 Epilepsy, unspecified, not intractable, without status epilepticus: Secondary | ICD-10-CM | POA: Insufficient documentation

## 2014-03-24 LAB — CBC WITH DIFFERENTIAL/PLATELET
Basophils Absolute: 0 10*3/uL (ref 0.0–0.1)
Basophils Relative: 1 % (ref 0–1)
EOS ABS: 0.1 10*3/uL (ref 0.0–0.7)
EOS PCT: 4 % (ref 0–5)
HEMATOCRIT: 40 % (ref 39.0–52.0)
Hemoglobin: 13.7 g/dL (ref 13.0–17.0)
LYMPHS ABS: 1.3 10*3/uL (ref 0.7–4.0)
Lymphocytes Relative: 38 % (ref 12–46)
MCH: 27.3 pg (ref 26.0–34.0)
MCHC: 34.3 g/dL (ref 30.0–36.0)
MCV: 79.8 fL (ref 78.0–100.0)
MONO ABS: 0.3 10*3/uL (ref 0.1–1.0)
Monocytes Relative: 10 % (ref 3–12)
Neutro Abs: 1.6 10*3/uL — ABNORMAL LOW (ref 1.7–7.7)
Neutrophils Relative %: 48 % (ref 43–77)
PLATELETS: 192 10*3/uL (ref 150–400)
RBC: 5.01 MIL/uL (ref 4.22–5.81)
RDW: 13.3 % (ref 11.5–15.5)
WBC: 3.3 10*3/uL — ABNORMAL LOW (ref 4.0–10.5)

## 2014-03-24 LAB — URINE MICROSCOPIC-ADD ON

## 2014-03-24 LAB — URINALYSIS, ROUTINE W REFLEX MICROSCOPIC
BILIRUBIN URINE: NEGATIVE
GLUCOSE, UA: NEGATIVE mg/dL
Ketones, ur: NEGATIVE mg/dL
LEUKOCYTES UA: NEGATIVE
Nitrite: NEGATIVE
Specific Gravity, Urine: >1.03 — ABNORMAL HIGH (ref 1.005–1.030)
Urobilinogen, UA: 0.2 mg/dL (ref 0.0–1.0)
pH: 5.5 (ref 5.0–8.0)

## 2014-03-24 LAB — BASIC METABOLIC PANEL
BUN: 8 mg/dL (ref 6–23)
CALCIUM: 9.2 mg/dL (ref 8.4–10.5)
CO2: 27 mEq/L (ref 19–32)
Chloride: 101 mEq/L (ref 96–112)
Creatinine, Ser: 1.11 mg/dL (ref 0.50–1.35)
GFR calc Af Amer: >90 mL/min (ref >90)
GFR, EST NON AFRICAN AMERICAN: 88 mL/min — AB (ref >90)
Glucose, Bld: 108 mg/dL — ABNORMAL HIGH (ref 70–99)
Potassium: 3.8 mEq/L (ref 3.7–5.3)
SODIUM: 140 meq/L (ref 137–147)

## 2014-03-24 LAB — RAPID URINE DRUG SCREEN, HOSP PERFORMED
AMPHETAMINES: NOT DETECTED
Barbiturates: NOT DETECTED
Benzodiazepines: NOT DETECTED
Cocaine: NOT DETECTED
Opiates: NOT DETECTED
Tetrahydrocannabinol: NOT DETECTED

## 2014-03-24 LAB — ETHANOL

## 2014-03-24 NOTE — ED Notes (Signed)
Seizure pads in place

## 2014-03-24 NOTE — Discharge Instructions (Signed)
Do not drive a vehicle, operate heavy equipment, or engage in dangerous activities, until you see your neurologist.    Seizure, Adult A seizure is abnormal electrical activity in the brain. Seizures usually last from 30 seconds to 2 minutes. There are various types of seizures. Before a seizure, you may have a warning sensation (aura) that a seizure is about to occur. An aura may include the following symptoms:   Fear or anxiety.  Nausea.  Feeling like the room is spinning (vertigo).  Vision changes, such as seeing flashing lights or spots. Common symptoms during a seizure include:  A change in attention or behavior (altered mental status).  Convulsions with rhythmic jerking movements.  Drooling.  Rapid eye movements.  Grunting.  Loss of bladder and bowel control.  Bitter taste in the mouth.  Tongue biting. After a seizure, you may feel confused and sleepy. You may also have an injury resulting from convulsions during the seizure. HOME CARE INSTRUCTIONS   If you are given medicines, take them exactly as prescribed by your health care provider.  Keep all follow-up appointments as directed by your health care provider.  Do not swim or drive or engage in risky activity during which a seizure could cause further injury to you or others until your health care provider says it is OK.  Get adequate rest.  Teach friends and family what to do if you have a seizure. They should:  Lay you on the ground to prevent a fall.  Put a cushion under your head.  Loosen any tight clothing around your neck.  Turn you on your side. If vomiting occurs, this helps keep your airway clear.  Stay with you until you recover.  Know whether or not you need emergency care. SEEK IMMEDIATE MEDICAL CARE IF:  The seizure lasts longer than 5 minutes.  The seizure is severe or you do not wake up immediately after the seizure.  You have an altered mental status after the seizure.  You are  having more frequent or worsening seizures. Someone should drive you to the emergency department or call local emergency services (911 in [place] MAKE SURE YOU:  Understand these instructions.  Will watch your condition.  Will get help right away if you are not doing well or get worse. Document Released: 12/09/2000 Document Revised: 10/02/2013 Document Reviewed: 07/24/2013 [facility] SuiteExit[facility] Patient Information 2014 [place]ExitCare, [place]LL[place].
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
## 2014-03-27 LAB — LEVETIRACETAM LEVEL: Levetiracetam Lvl: 20.6 ug/mL

## 2014-08-27 ENCOUNTER — Emergency Department (HOSPITAL_COMMUNITY)
Admission: EM | Admit: 2014-08-27 | Discharge: 2014-08-27 | Disposition: A | Discharge status: Home / Self Care | Payer: Self-pay | Attending: Emergency Medicine | Admitting: Emergency Medicine

## 2014-08-27 ENCOUNTER — Encounter (HOSPITAL_COMMUNITY): Payer: Self-pay | Admitting: Emergency Medicine

## 2014-08-27 ENCOUNTER — Emergency Department (HOSPITAL_COMMUNITY): Payer: Self-pay

## 2014-08-27 DIAGNOSIS — G40909 Epilepsy, unspecified, not intractable, without status epilepticus: Secondary | ICD-10-CM | POA: Insufficient documentation

## 2014-08-27 DIAGNOSIS — IMO0002 Reserved for concepts with insufficient information to code with codable children: Secondary | ICD-10-CM | POA: Insufficient documentation

## 2014-08-27 DIAGNOSIS — Y9229 Other specified public building as the place of occurrence of the external cause: Secondary | ICD-10-CM | POA: Insufficient documentation

## 2014-08-27 DIAGNOSIS — Y9389 Activity, other specified: Secondary | ICD-10-CM | POA: Insufficient documentation

## 2014-08-27 DIAGNOSIS — R569 Unspecified convulsions: Secondary | ICD-10-CM | POA: Insufficient documentation

## 2014-08-27 DIAGNOSIS — S0180XA Unspecified open wound of other part of head, initial encounter: Secondary | ICD-10-CM | POA: Insufficient documentation

## 2014-08-27 LAB — COMPREHENSIVE METABOLIC PANEL
ALBUMIN: 4.3 g/dL (ref 3.5–5.2)
ALK PHOS: 64 U/L (ref 39–117)
ALT: 10 U/L (ref 0–53)
AST: 17 U/L (ref 0–37)
Anion gap: 13 (ref 5–15)
BILIRUBIN TOTAL: 0.9 mg/dL (ref 0.3–1.2)
BUN: 8 mg/dL (ref 6–23)
CHLORIDE: 101 meq/L (ref 96–112)
CO2: 26 meq/L (ref 19–32)
CREATININE: 1.08 mg/dL (ref 0.50–1.35)
Calcium: 9 mg/dL (ref 8.4–10.5)
GFR calc Af Amer: >90 mL/min (ref >90)
Glucose, Bld: 98 mg/dL (ref 70–99)
POTASSIUM: 3.7 meq/L (ref 3.7–5.3)
Sodium: 140 mEq/L (ref 137–147)
Total Protein: 7 g/dL (ref 6.0–8.3)

## 2014-08-27 LAB — CBG MONITORING, ED: Glucose-Capillary: 91 mg/dL (ref 70–99)

## 2014-08-27 MED ORDER — LORAZEPAM 2 MG/ML IJ SOLN
1.0000 mg | Freq: Once | INTRAMUSCULAR | Status: AC
  Administered 2014-08-27: 1 mg via INTRAVENOUS
  Filled 2014-08-27: qty 1

## 2014-08-27 MED ORDER — LIDOCAINE-EPINEPHRINE 1 %-1:100000 IJ SOLN: 10.0000 mL | Freq: Once | INTRAMUSCULAR | Status: DC

## 2014-08-27 MED ORDER — LIDOCAINE-EPINEPHRINE (PF) 1 %-1:200000 IJ SOLN
INTRAMUSCULAR | Status: AC
  Filled 2014-08-27: qty 10

## 2014-08-27 NOTE — Discharge Instructions (Signed)
Removal from the wound in 7 days.  No driving until cleared by neurology. Return if any additional seizure activity.  Continue your medicine at its current dosage without change.  Laceration Care, Adult A laceration is a cut that goes through all layers of the skin. The cut goes into the tissue beneath the skin. HOME CARE For stitches (sutures) or staples:  Keep the cut clean and dry.  If you have a bandage (dressing), change it at least once a day. Change the bandage if it gets wet or dirty, or as told by your doctor.  Wash the cut with soap and water 2 times a day. Rinse the cut with water. Pat it dry with a clean towel.  Put a thin layer of medicated cream on the cut as told by your doctor.  You may shower after the first 24 hours. Do not soak the cut in water until the stitches are removed.  Only take medicines as told by your doctor.  Have your stitches or staples removed as told by your doctor. For skin adhesive strips:  Keep the cut clean and dry.  Do not get the strips wet. You may take a bath, but be careful to keep the cut dry.  If the cut gets wet, pat it dry with a clean towel.  The strips will fall off on their own. Do not remove the strips that are still stuck to the cut. For wound glue:  You may shower or take baths. Do not soak or scrub the cut. Do not swim. Avoid heavy sweating until the glue falls off on its own. After a shower or bath, pat the cut dry with a clean towel.  Do not put medicine on your cut until the glue falls off.  If you have a bandage, do not put tape over the glue.  Avoid lots of sunlight or tanning lamps until the glue falls off. Put sunscreen on the cut for the first year to reduce your scar.  The glue will fall off on its own. Do not pick at the glue. You may need a tetanus shot if:  You cannot remember when you had your last tetanus shot.  You have never had a tetanus shot. If you need a tetanus shot and you choose not to have  one, you may get tetanus. Sickness from tetanus can be serious. GET HELP RIGHT AWAY IF:   Your pain does not get better with medicine.  Your arm, hand, leg, or foot loses feeling (numbness) or changes color.  Your cut is bleeding.  Your joint feels weak, or you cannot use your joint.  You have painful lumps on your body.  Your cut is red, puffy (swollen), or painful.  You have a red line on the skin near the cut.  You have yellowish-white fluid (pus) coming from the cut.  You have a fever.  You have a bad smell coming from the cut or bandage.  Your cut breaks open before or after stitches are removed.  You notice something coming out of the cut, such as wood or glass.  You cannot move a finger or toe. MAKE SURE YOU:   Understand these instructions.  Will watch your condition.  Will get help right away if you are not doing well or get worse. Document Released: 05/30/2008 Document Revised: 03/05/2012 Document Reviewed: 06/07/2011 [facility] Patient Information 2015 [place], [place]. This information is not intended to replace advice given to you by your health care provider. Make  sure you discuss any questions you have with your health care provider.  Seizure, Adult A seizure means there is unusual activity in the brain. A seizure can cause changes in attention or behavior. Seizures often cause shaking (convulsions). Seizures often last from 30 seconds to 2 minutes. HOME CARE   If you are given medicines, take them exactly as told by your doctor.  Keep all doctor visits as told.  Do not swim or drive until your doctor says it is okay.  Teach others what to do if you have a seizure. They should:  Lay you on the ground.  Put a cushion under your head.  Loosen any tight clothing around your neck.  Turn you on your side.  Stay with you until you get better. GET HELP RIGHT AWAY IF:   The seizure lasts longer than 2 to 5 minutes.  The seizure is very bad.  The  person does not wake up after the seizure.  The person's attention or behavior changes. Drive the person to the emergency room or call your local emergency services (911 in [place] MAKE SURE YOU:   Understand these instructions.  Will watch your condition.  Will get help right away if you are not doing well or get worse. Document Released: 05/30/2008 Document Revised: 03/05/2012 Document Reviewed: 11/30/2011 [facility] Patient Information 2015 [place], [place]. This information is not intended to replace advice given to you by your health care provider. Make sure you discuss any questions you have with your health care provider.

## 2014-08-27 NOTE — ED Notes (Addendum)
[place]. Witnessed seizure by mother, struck head on counter at theater. Large laceration to right eyebrow. Bleeding controlled at present. Patient post-ictal upon EMS arrival. Arrives alert. Oriented to person at present.

## 2014-08-27 NOTE — ED Notes (Signed)
Patient with no complaints at this time. Respirations even and unlabored. Skin warm/dry. Discharge instructions reviewed with patient at this time. Patient given opportunity to voice concerns/ask questions. IV removed per policy and band-aid applied to site. Patient discharged at this time and left Emergency Department with steady gait.  

## 2014-08-27 NOTE — ED Notes (Signed)
Dressing applied to sutured laceration on right eyebrow. Wrapped with ace wrap per MD. Wound care instructions given to patient's mother.

## 2014-08-27 NOTE — ED Provider Notes (Signed)
CSN: [id number]     Arrival date & time 08/27/14  1417 History   First MD Initiated Contact with Patient 08/27/14 1430    This chart was scribed for Rolland Porter, MD by Tonye Royalty, ED Scribe. This patient was seen in room APA19/APA19 and the patient's care was started at 2:36 PM.     Chief Complaint  Patient presents with  . Seizures   The history is provided by the patient. No language interpreter was used.    HPI Comments: Dan Mathews is a 28 y.o. male who presents to the Emergency Department complaining of seizure with onset earlier today. He states he was walking back to work after eating and the next thing he remembers is waking up at the hospital. He reports a history of seizures but states he has not had one in a long time and is compliant with his medication. He states he has been taking the same dose of Keppra for approximately a year, at which time it was increased. He reports associated headache and an injury to his head, likely on a counter-top, though the seizure was not witnessed. He denies nausea, vomiting, or recent difficulty sleeping.  Past Medical History  Diagnosis Date  . Seizures   . Seizures    History reviewed. No pertinent past surgical history. Family History  Problem Relation Age of Onset  . Hypertension Mother    History  Substance Use Topics  . Smoking status: Never Smoker   . Smokeless tobacco: Not on file  . Alcohol Use: No    Review of Systems  Constitutional: Negative for fever, chills, diaphoresis, appetite change and fatigue.  HENT: Negative for mouth sores, sore throat and trouble swallowing.   Eyes: Negative for visual disturbance.  Respiratory: Negative for cough, chest tightness, shortness of breath and wheezing.   Cardiovascular: Negative for chest pain.  Gastrointestinal: Negative for nausea, vomiting, abdominal pain, diarrhea and abdominal distention.  Endocrine: Negative for polydipsia, polyphagia and polyuria.  Genitourinary:  Negative for dysuria, frequency and hematuria.  Musculoskeletal: Negative for gait problem.  Skin: Positive for wound (laceration to head). Negative for color change, pallor and rash.  Neurological: Positive for seizures and headaches. Negative for dizziness, syncope and light-headedness.  Hematological: Does not bruise/bleed easily.  Psychiatric/Behavioral: Negative for behavioral problems and confusion.      Allergies  Review of patient's allergies indicates no known allergies.  Home Medications   Prior to Admission medications   Medication Sig Start Date End Date Taking? Authorizing Provider  levETIRAcetam (KEPPRA) 1000 MG tablet Take 1,500 mg by mouth 2 (two) times daily.   Yes Historical Provider, MD   BP 107/75  Pulse 62  Temp(Src) 98.7 F (37.1 C) (Oral)  Resp 13  Ht 6' (1.829 m)  Wt 140 lb (63.504 kg)  BMI 18.98 kg/m2  SpO2 96% Physical Exam  Nursing note and vitals reviewed. Constitutional: He is oriented to person, place, and time. He appears well-developed and well-nourished. No distress.  HENT:  Head: Normocephalic.  No blood behind TMs  Eyes: Conjunctivae are normal. Pupils are equal, round, and reactive to light. No scleral icterus.  Neck: Normal range of motion. Neck supple. No thyromegaly present.  No midline tenderness  Cardiovascular: Normal rate and regular rhythm.  Exam reveals no gallop and no friction rub.   No murmur heard. Pulmonary/Chest: Effort normal and breath sounds normal. No respiratory distress. He has no wheezes. He has no rales.  Abdominal: Soft. Bowel sounds are normal. He  exhibits no distension. There is no tenderness. There is no rebound.  Musculoskeletal: Normal range of motion.  Neurological: He is alert and oriented to person, place, and time. No cranial nerve deficit. Coordination normal.  Skin: Skin is warm and dry. No rash noted.  4cm L shaped laceration to his right lateral eyebrow  Psychiatric: He has a normal mood and affect.  His behavior is normal.    ED Course  LACERATION REPAIR Date/Time: 08/27/2014 3:59 PM Performed by: Rolland Porter Authorized by: Rolland Porter Consent: Verbal consent obtained. written consent obtained. Risks and benefits: risks, benefits and alternatives were discussed Consent given by: patient Body area: head/neck Location details: right eyebrow Laceration length: 4 cm Foreign bodies: no foreign bodies Tendon involvement: none Nerve involvement: none Vascular damage: yes Anesthesia: local infiltration Local anesthetic: lidocaine 1% with epinephrine Anesthetic total: 4 ml Irrigation solution: saline Irrigation method: syringe Amount of cleaning: standard Debridement: none Degree of undermining: none Skin closure: 5-0 nylon Wound mucous membrane closure material used: 4-0 Vicryl. Subcutaneous closure: 4-0 Vicryl Number of sutures: 6 Technique: horizontal mattress Approximation: close Approximation difficulty: simple Dressing: 4x4 sterile gauze (ace wrap)   (including critical care time) Labs Review Labs Reviewed  COMPREHENSIVE METABOLIC PANEL  CBG MONITORING, ED    Imaging Review Ct Head Wo Contrast  08/27/2014   CLINICAL DATA:  Seizure activity striking the frontal aspect of the calvarium  EXAM: CT HEAD WITHOUT CONTRAST  TECHNIQUE: Contiguous axial images were obtained from the base of the skull through the vertex without intravenous contrast.  COMPARISON:  Noncontrast CT scan of the brain of January 06, 2013  FINDINGS: The ventricles are normal in size and position. There is stable subtle decreased density in the deep white matter of both cerebral hemispheres. There is no intracranial hemorrhage. There is no evidence of acute ischemia. The cerebellum and brainstem are normal.  There is a laceration over the lateral aspect of the right forehead. There is no acute skull fracture. The observed paranasal sinuses are clear.  IMPRESSION: 1. There is no acute intracranial hemorrhage  nor evidence of acute ischemic change. 2. 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 3. There is a laceration over the lateral aspect of the right forehead without underlying skull fracture.   Electronically Signed   By: David  [place]   On: 08/27/2014 15:27     EKG Interpretation None     DIAGNOSTIC STUDIES: Oxygen Saturation is 96% on room air, normal by my interpretation.    COORDINATION OF CARE:    MDM   Final diagnoses:  Seizure  Laceration    CT normal. Laceration repaired. Fashion: Anxious is supple breakthrough seizure. He is on max dose Keppra.  Appropriate for discharge. Return if any additional seizures. Routine follow up with primary care for suture removal in 7 days. Neurologist as scheduled.   Rolland Porter, MD 08/27/14 1600

## 2014-09-03 ENCOUNTER — Encounter (HOSPITAL_COMMUNITY): Payer: Self-pay | Admitting: Emergency Medicine

## 2014-09-03 ENCOUNTER — Emergency Department (HOSPITAL_COMMUNITY)
Admission: EM | Admit: 2014-09-03 | Discharge: 2014-09-03 | Disposition: A | Discharge status: Home / Self Care | Payer: No Typology Code available for payment source | Attending: Emergency Medicine | Admitting: Emergency Medicine

## 2014-09-03 DIAGNOSIS — Z4802 Encounter for removal of sutures: Secondary | ICD-10-CM | POA: Insufficient documentation

## 2014-09-03 DIAGNOSIS — G40909 Epilepsy, unspecified, not intractable, without status epilepticus: Secondary | ICD-10-CM | POA: Insufficient documentation

## 2014-09-03 DIAGNOSIS — Z79899 Other long term (current) drug therapy: Secondary | ICD-10-CM | POA: Insufficient documentation

## 2014-09-03 NOTE — Discharge Instructions (Signed)

## 2014-09-03 NOTE — ED Provider Notes (Signed)
CSN: [id number]     Arrival date & time 09/03/14  1642 History   First MD Initiated Contact with Patient 09/03/14 1720     Chief Complaint  Patient presents with  . Suture / Staple Removal     (Consider location/radiation/quality/duration/timing/severity/associated sxs/prior Treatment) Patient is a 28 y.o. male presenting with suture removal. The history is provided by the patient.  Suture / Staple Removal This is a new problem. The current episode started in the past 7 days. The problem has been gradually improving. Pertinent negatives include no abdominal pain, arthralgias, chest pain, chills, coughing, fever, nausea, neck pain, numbness, rash or swollen glands. Nothing aggravates the symptoms. Treatments tried: sutured wound right side of face. The treatment provided significant relief.    Past Medical History  Diagnosis Date  . Seizures   . Seizures    History reviewed. No pertinent past surgical history. Family History  Problem Relation Age of Onset  . Hypertension Mother    History  Substance Use Topics  . Smoking status: Never Smoker   . Smokeless tobacco: Not on file  . Alcohol Use: No    Review of Systems  Constitutional: Negative for fever, chills and activity change.       All ROS Neg except as noted in HPI  HENT: Negative for nosebleeds.   Eyes: Negative for photophobia and discharge.  Respiratory: Negative for cough, shortness of breath and wheezing.   Cardiovascular: Negative for chest pain and palpitations.  Gastrointestinal: Negative for nausea, abdominal pain and blood in stool.  Genitourinary: Negative for dysuria, frequency and hematuria.  Musculoskeletal: Negative for arthralgias, back pain and neck pain.  Skin: Negative.  Negative for rash.  Neurological: Negative for dizziness, seizures, speech difficulty and numbness.  Psychiatric/Behavioral: Negative for hallucinations and confusion.      Allergies  Review of patient's allergies indicates no  known allergies.  Home Medications   Prior to Admission medications   Medication Sig Start Date End Date Taking? Authorizing Provider  levETIRAcetam (KEPPRA) 1000 MG tablet Take 1,500 mg by mouth 2 (two) times daily.    Historical Provider, MD   BP 114/61  Pulse 52  Temp(Src) 98 F (36.7 C) (Oral)  Resp 18  Ht 6' (1.829 m)  Wt 139 lb (63.05 kg)  BMI 18.85 kg/m2  SpO2 94% Physical Exam  Nursing note and vitals reviewed. Constitutional: He is oriented to person, place, and time. He appears well-developed and well-nourished.  Non-toxic appearance.  HENT:  Head: Normocephalic.    Right Ear: Tympanic membrane and external ear normal.  Left Ear: Tympanic membrane and external ear normal.  Eyes: EOM and lids are normal. Pupils are equal, round, and reactive to light.  Neck: Normal range of motion. Neck supple. Carotid bruit is not present.  Cardiovascular: Normal rate, regular rhythm, normal heart sounds, intact distal pulses and normal pulses.   Pulmonary/Chest: Breath sounds normal. No respiratory distress.  Abdominal: Soft. Bowel sounds are normal. There is no tenderness. There is no guarding.  Musculoskeletal: Normal range of motion.  Lymphadenopathy:       Head (right side): No submandibular adenopathy present.       Head (left side): No submandibular adenopathy present.    He has no cervical adenopathy.  Neurological: He is alert and oriented to person, place, and time. He has normal strength. No cranial nerve deficit or sensory deficit.  Skin: Skin is warm and dry.  Psychiatric: He has a normal mood and affect. His speech is normal.  ED Course  Procedures (including critical care time) Labs Review Labs Reviewed - No data to display  Imaging Review No results found.   EKG Interpretation None      MDM Vital signs are well within normal limits. Sutured laceration to the left eyebrow and face healing nicely. No signs of infection. No motor sensory deficits  appreciated. Sutures removed without problem. Patient to return if any changes or problems.    Final diagnoses:  Visit for suture removal    **I have reviewed nursing notes, vital signs, and all appropriate lab and imaging results for this patient.Kathie Dike, PA-C 09/05/14 1102

## 2014-09-03 NOTE — ED Notes (Signed)
Sutures removed.

## 2014-09-03 NOTE — ED Notes (Signed)
Pt reports had stitches placed to right eyebrow last Tuesday. Pt reports has returned to have them removed. nad noted.

## 2014-09-06 NOTE — ED Provider Notes (Signed)
Medical screening examination/treatment/procedure(s) were performed by non-physician practitioner and as supervising physician I was immediately available for consultation/collaboration.   EKG Interpretation None        Joya Gaskins, MD 09/06/14 [phone]

## 2014-10-28 ENCOUNTER — Emergency Department (HOSPITAL_COMMUNITY)
Admission: EM | Admit: 2014-10-28 | Discharge: 2014-10-28 | Disposition: A | Discharge status: Home / Self Care | Payer: Self-pay | Attending: Emergency Medicine | Admitting: Emergency Medicine

## 2014-10-28 ENCOUNTER — Emergency Department (HOSPITAL_COMMUNITY)
Admission: EM | Admit: 2014-10-28 | Discharge: 2014-10-28 | Disposition: A | Discharge status: Home / Self Care | Payer: No Typology Code available for payment source | Attending: Emergency Medicine | Admitting: Emergency Medicine

## 2014-10-28 ENCOUNTER — Emergency Department (HOSPITAL_COMMUNITY): Payer: Self-pay

## 2014-10-28 ENCOUNTER — Encounter (HOSPITAL_COMMUNITY): Payer: Self-pay | Admitting: Physical Medicine and Rehabilitation

## 2014-10-28 ENCOUNTER — Encounter (HOSPITAL_COMMUNITY): Payer: Self-pay | Admitting: Emergency Medicine

## 2014-10-28 DIAGNOSIS — R52 Pain, unspecified: Secondary | ICD-10-CM

## 2014-10-28 DIAGNOSIS — Y9289 Other specified places as the place of occurrence of the external cause: Secondary | ICD-10-CM | POA: Insufficient documentation

## 2014-10-28 DIAGNOSIS — W01198A Fall on same level from slipping, tripping and stumbling with subsequent striking against other object, initial encounter: Secondary | ICD-10-CM | POA: Insufficient documentation

## 2014-10-28 DIAGNOSIS — R569 Unspecified convulsions: Secondary | ICD-10-CM

## 2014-10-28 DIAGNOSIS — S0993XD Unspecified injury of face, subsequent encounter: Secondary | ICD-10-CM

## 2014-10-28 DIAGNOSIS — Z79899 Other long term (current) drug therapy: Secondary | ICD-10-CM | POA: Insufficient documentation

## 2014-10-28 DIAGNOSIS — G40909 Epilepsy, unspecified, not intractable, without status epilepticus: Secondary | ICD-10-CM | POA: Insufficient documentation

## 2014-10-28 DIAGNOSIS — Y9301 Activity, walking, marching and hiking: Secondary | ICD-10-CM | POA: Insufficient documentation

## 2014-10-28 DIAGNOSIS — S0081XA Abrasion of other part of head, initial encounter: Secondary | ICD-10-CM | POA: Insufficient documentation

## 2014-10-28 DIAGNOSIS — Z792 Long term (current) use of antibiotics: Secondary | ICD-10-CM | POA: Insufficient documentation

## 2014-10-28 LAB — COMPREHENSIVE METABOLIC PANEL
ALBUMIN: 4.4 g/dL (ref 3.5–5.2)
ALT: 11 U/L (ref 0–53)
AST: 20 U/L (ref 0–37)
Alkaline Phosphatase: 72 U/L (ref 39–117)
Anion gap: 11 (ref 5–15)
BUN: 8 mg/dL (ref 6–23)
CALCIUM: 9.1 mg/dL (ref 8.4–10.5)
CO2: 29 mEq/L (ref 19–32)
CREATININE: 1 mg/dL (ref 0.50–1.35)
Chloride: 100 mEq/L (ref 96–112)
GFR calc Af Amer: >90 mL/min (ref >90)
GFR calc non Af Amer: >90 mL/min (ref >90)
Glucose, Bld: 117 mg/dL — ABNORMAL HIGH (ref 70–99)
Potassium: 3.6 mEq/L — ABNORMAL LOW (ref 3.7–5.3)
SODIUM: 140 meq/L (ref 137–147)
Total Bilirubin: 1 mg/dL (ref 0.3–1.2)
Total Protein: 7.4 g/dL (ref 6.0–8.3)

## 2014-10-28 LAB — CBC WITH DIFFERENTIAL/PLATELET
BASOS ABS: 0 10*3/uL (ref 0.0–0.1)
BASOS PCT: 0 % (ref 0–1)
Eosinophils Absolute: 0 10*3/uL (ref 0.0–0.7)
Eosinophils Relative: 0 % (ref 0–5)
HEMATOCRIT: 41.4 % (ref 39.0–52.0)
Hemoglobin: 14.5 g/dL (ref 13.0–17.0)
LYMPHS PCT: 5 % — AB (ref 12–46)
Lymphs Abs: 0.6 10*3/uL — ABNORMAL LOW (ref 0.7–4.0)
MCH: 27.4 pg (ref 26.0–34.0)
MCHC: 35 g/dL (ref 30.0–36.0)
MCV: 78.1 fL (ref 78.0–100.0)
MONO ABS: 0.6 10*3/uL (ref 0.1–1.0)
Monocytes Relative: 5 % (ref 3–12)
Neutro Abs: 11 10*3/uL — ABNORMAL HIGH (ref 1.7–7.7)
Neutrophils Relative %: 90 % — ABNORMAL HIGH (ref 43–77)
PLATELETS: 208 10*3/uL (ref 150–400)
RBC: 5.3 MIL/uL (ref 4.22–5.81)
RDW: 13.1 % (ref 11.5–15.5)
WBC: 12.2 10*3/uL — AB (ref 4.0–10.5)

## 2014-10-28 MED ORDER — ONDANSETRON 4 MG PO TBDP
8.0000 mg | ORAL_TABLET | Freq: Once | ORAL | Status: AC
  Administered 2014-10-28: 8 mg via ORAL
  Filled 2014-10-28: qty 2

## 2014-10-28 MED ORDER — LEVETIRACETAM 750 MG PO TABS
2000.0000 mg | ORAL_TABLET | Freq: Once | ORAL | Status: AC
  Administered 2014-10-28: 2000 mg via ORAL
  Filled 2014-10-28: qty 1

## 2014-10-28 MED ORDER — AMOXICILLIN 250 MG PO CAPS
500.0000 mg | ORAL_CAPSULE | Freq: Once | ORAL | Status: AC
  Administered 2014-10-28: 500 mg via ORAL
  Filled 2014-10-28: qty 2

## 2014-10-28 MED ORDER — AMOXICILLIN 500 MG PO CAPS: 500.0000 mg | ORAL_CAPSULE | Freq: Three times a day (TID) | ORAL | Status: DC

## 2014-10-28 NOTE — ED Notes (Addendum)
Pt had witnessed grand mal seizure, unknown duration, while walking down sidewalk approximately 15 minutes ago. Pt feel onto face from standing position. Pt missing front tooth and swelling to upper lip. Laceration above right eye and jagged laceration to right upper lip. Pt alert and oriented to person/place/year and circumstance. c-collar and lsb in place. cbg 95 in route. Pt has h/s seizure disorder, takes dilantin. Nad noted.

## 2014-10-28 NOTE — ED Notes (Signed)
Sent Dr. Russella DarBenitez from [facility] to [phone]

## 2014-10-28 NOTE — ED Provider Notes (Addendum)
CSN: [id number]     Arrival date & time 10/28/14  1237 History  This chart was scribed for Doug SouSam Joli Koob, MD by Annye AsaAnna Dorsett, ED Scribe. This patient was seen in room APA03/APA03 and the patient's care was started at 1:30 PM.    Chief Complaint  Patient presents with  . Seizures   The history is provided by the patient. No language interpreter was used.     HPI Comments: Dan Mathews is a 28 y.o. male with past medical history of seizures who presents to the Emergency Department complaining of a seizure today approx.1200 today. . Family explains that the patient was on his lunch break and fell face-first on the side walk while walking outside. They did not see the seizure. Patient is sore at present along the right side of his face. He states that he "does not need" a TDAP vaccine at this time. 2. Avulsed as result of seizure today. Last seizure 08/27/2014  Patient takes Keppra for his seizures; does not take Dilantinhe reports that he is compliant with his medications and took his last dose today. He has not had a seizure in several months (last seizure 08/27/2014). He states that he has a seizure every 6 months of so. Presently asymptomatic except for right-sided facial "sorenessHis first seizure was while he was in the fourth grade.   He denies smoking, EtOH use, other drug use. He denies other medical problems. He denies any other medications. .   Past Medical History  Diagnosis Date  . Seizures   . Seizures    History reviewed. No pertinent past surgical history. Family History  Problem Relation Age of Onset  . Hypertension Mother    History  Substance Use Topics  . Smoking status: Never Smoker   . Smokeless tobacco: Not on file  . Alcohol Use: No    Review of Systems  Constitutional: Negative.   HENT: Negative.   Respiratory: Negative.   Cardiovascular: Negative for chest pain.       Syncope  Gastrointestinal: Negative.   Musculoskeletal: Negative.   Skin: Positive  for wound.       Facial abrasions  Allergic/Immunologic: Negative.   Neurological: Positive for seizures.  Psychiatric/Behavioral: Negative.   All other systems reviewed and are negative.   Allergies  Review of patient's allergies indicates no known allergies.  Home Medications   Prior to Admission medications   Medication Sig Start Date End Date Taking? Authorizing Provider  levETIRAcetam (KEPPRA) 1000 MG tablet Take 2,000 mg by mouth 2 (two) times daily.    Yes Historical Provider, MD  tetrahydrozoline 0.05 % ophthalmic solution Place 1 drop into both eyes daily as needed (dry eyes).   Yes Historical Provider, MD   BP 109/73 mmHg  Pulse 78  Temp(Src) 98.1 F (36.7 C)  Resp 18  Ht 6\' 1"  (1.854 m)  Wt 130 lb (58.968 kg)  BMI 17.16 kg/m2  SpO2 100% Physical Exam  Constitutional: He is oriented to person, place, and time. He appears well-developed and well-nourished. No distress.  HENT:  Head: Normocephalic and atraumatic.  Right face with abrasions of forehead and right cheek.tooth #8 is missing. Tooth #7 slightly limited  Eyes: Conjunctivae are normal. Pupils are equal, round, and reactive to light.  Neck: Neck supple. No tracheal deviation present. No thyromegaly present.  Cardiovascular: Normal rate and regular rhythm.   No murmur heard. Pulmonary/Chest: Effort normal and breath sounds normal.  Abdominal: Soft. Bowel sounds are normal. He exhibits no distension.  There is no tenderness.  Musculoskeletal: Normal range of motion. He exhibits no edema or tenderness.  Neurological: He is alert and oriented to person, place, and time. He has normal reflexes. No cranial nerve deficit. Coordination normal.  Skin: Skin is warm and dry. No rash noted.  Psychiatric: He has a normal mood and affect.  Nursing note and vitals reviewed.   ED Course  Procedures   DIAGNOSTIC STUDIES: Oxygen Saturation is 100% on RA, normal by my interpretation.    COORDINATION OF CARE: 1:34 PM  Discussed treatment plan with pt at bedside and pt agreed to plan.   Labs Review Labs Reviewed - No data to display  Imaging Review No results found.   EKG Interpretation   Date/Time:  Tuesday October 28 2014 12:38:05 EST Ventricular Rate:  76 PR Interval:  141 QRS Duration: 105 QT Interval:  379 QTC Calculation: 426 R Axis:   73 Text Interpretation:  Sinus rhythm RSR' in V1 or V2, probably normal  variant Nonspecific T abnormalities, lateral leads Confirmed by COOK  MD,  BRIAN ([id number]) on 10/28/2014 12:55:14 PM     Mother brought tooth #8 intact in a sandwich bag . Procedure I irrigated loose tooth with saline. It was reimplanted easily in good position in the socket. It was cemented in place usingCOE PAK cement from dental  2:50 PM patient is resting comfortably states discomfort is minimal. He is alert ambulate without difficulty Results for orders placed or performed during the hospital encounter of 08/27/14  Comprehensive metabolic panel  Result Value Ref Range   Sodium 140 137 - 147 mEq/L   Potassium 3.7 3.7 - 5.3 mEq/L   Chloride 101 96 - 112 mEq/L   CO2 26 19 - 32 mEq/L   Glucose, Bld 98 70 - 99 mg/dL   BUN 8 6 - 23 mg/dL   Creatinine, Ser 6.04 0.50 - 1.35 mg/dL   Calcium 9.0 8.4 - 54.0 mg/dL   Total Protein 7.0 6.0 - 8.3 g/dL   Albumin 4.3 3.5 - 5.2 g/dL   AST 17 0 - 37 U/L   ALT 10 0 - 53 U/L   Alkaline Phosphatase 64 39 - 117 U/L   Total Bilirubin 0.9 0.3 - 1.2 mg/dL   GFR calc non Af Amer >90 >90 mL/min   GFR calc Af Amer >90 >90 mL/min   Anion gap 13 5 - 15  CBG monitoring, ED  Result Value Ref Range   Glucose-Capillary 91 70 - 99 mg/dL   Ct Maxillofacial Wo Cm  10/28/2014   CLINICAL DATA:  Facial trauma secondary to a fall created by a seizure. Abrasions and swelling of the right side of the face.  EXAM: CT MAXILLOFACIAL WITHOUT CONTRAST  TECHNIQUE: Multidetector CT imaging of the maxillofacial structures was performed. Multiplanar CT image  reconstructions were also generated. A small metallic BB was placed on the right temple in order to reliably differentiate right from left.  COMPARISON:  CT scan head dated 08/27/2014  FINDINGS: There are acute depressed fractures of and her wall ovary right maxillary sinus. There also fractures of the lateral, medial and posterior walls of the right maxillary sinus. There is a fracture of the right inferior orbital rim extending posteriorly through the orbital floor. There is no muscle entrapment or herniation of orbital contents into the maxillary sinus.  There is hemorrhage into the right maxillary sinus.  The fracture of the lateral wall of the sinus is along the superior margin of the alveolar  ridge.  There is also a fracture of the right side of the hard palate which extends between the roots of tooth 4 and tooth 5 and and then extends posteriorly. The fracture does not cross the midline.  There is lucency around the roots of tooth numbers 6, 7 and 8 consistent with loosening.There are multiple dental caries.  IMPRESSION: 1. Extensive fractures involving all walls of the right maxillary sinus as well as the right inferior orbital rim and orbital floor. 2. Fracture of the right side of the hard palate. 3. Loosening of the roots of tooth numbers 6, 7, in a.   Electronically Signed   By: Geanie CooleyJim  Maxwell M.D.   On: 10/28/2014 14:45    MDM  I spoke with Dr. Alfredo MartinezBenitez,periodontal this who will see patient immediately after discharge from here in his office.he will be transported by family member or by cabhe requests prescription for amoxicillin, written by me. I also spoke with Dr. Jenne PaneBates regarding facial bone fractures.Dr. Jenne PaneBates will see patient in approximately one week Final diagnoses:  None  patient is encouraged to follow up with Dr.[place] guarding seizures.Tylenol for pain Diagnoses #1 seizure disorder #2 avulsed tooth #8 #3 patient bone fractures   I personally performed the services described in  this documentation, which was scribed in my presence. The recorded information has been reviewed and considered.  Doug SouSam Gladiola Madore, MD 10/28/14 1459  Doug SouSam Huntington Leverich, MD 10/28/14 2258

## 2014-10-28 NOTE — ED Notes (Signed)
Suction used at bedside to suction blood from pt's mouth.

## 2014-10-28 NOTE — ED Provider Notes (Signed)
CSN: [id number]     Arrival date & time 10/28/14  1656 History   First MD Initiated Contact with Patient 10/28/14 1715     Chief Complaint  Patient presents with  . Seizures  . Dental Injury      HPI Patient has a known seizure disorder and had a seizure earlier today.  He was seen in outside emergency department and was noted to have right-sided facial fractures as well as an avulsion of tooth #8.  His tooth was reimplanted into the socket and was stabilized with coe-pak. He was sent to her local dentist who was going to attempt to stabilize and repair however the patient began vomiting in the dentist office and therefore he was sent to the emergency department for repeat evaluation.  The dentist was also concerned he is possibly altered.  Patient has no specific complaints at this time except for right-sided facial pain.  He is able to follow commands and answer all my questions.  Family reports no repeat seizures.  Denies chest pain abdominal pain.  No weakness of his arms or legs.  Denies neck pain.  CT imaging was performed at the outside hospital  Past Medical History  Diagnosis Date  . Seizures   . Seizures    History reviewed. No pertinent past surgical history. Family History  Problem Relation Age of Onset  . Hypertension Mother    History  Substance Use Topics  . Smoking status: Never Smoker   . Smokeless tobacco: Not on file  . Alcohol Use: No    Review of Systems  All other systems reviewed and are negative.     Allergies  Review of patient's allergies indicates no known allergies.  Home Medications   Prior to Admission medications   Medication Sig Start Date End Date Taking? Authorizing Provider  amoxicillin (AMOXIL) 500 MG capsule Take 1 capsule (500 mg total) by mouth 3 (three) times daily. 10/28/14  Yes Doug SouSam Jacubowitz, MD  levETIRAcetam (KEPPRA) 1000 MG tablet Take 2,000 mg by mouth 2 (two) times daily.    Yes Historical Provider, MD  tetrahydrozoline 0.05  % ophthalmic solution Place 1 drop into both eyes daily as needed (dry eyes).    Historical Provider, MD   BP 122/70 mmHg  Pulse 62  Temp(Src) 99 F (37.2 C) (Oral)  Resp 18  Ht 6' (1.829 m)  Wt 130 lb (58.968 kg)  BMI 17.63 kg/m2  SpO2 100% Physical Exam  Constitutional: He is oriented to person, place, and time. He appears well-developed and well-nourished.  HENT:  Head: Normocephalic and atraumatic.  Treatment #8 is now stabilized with coe-pak and is not freely movable at this time.  There is a small amount of bleeding noted from his right lower second molar without instability.  Easily controlled with time and root compression.multiple abrasions to the right side of face.  Extraocular movements are intact.  Vision is normal.  No trismus or malocclusion.  Eyes: EOM are normal.  Neck: Normal range of motion.  Cardiovascular: Normal rate, regular rhythm, normal heart sounds and intact distal pulses.   Pulmonary/Chest: Effort normal and breath sounds normal. No respiratory distress.  Abdominal: Soft. He exhibits no distension. There is no tenderness.  Musculoskeletal: Normal range of motion.  Neurological: He is alert and oriented to person, place, and time.  Skin: Skin is warm and dry.  Psychiatric: He has a normal mood and affect. Judgment normal.  Nursing note and vitals reviewed.   ED Course  Procedures (  including critical care time) Labs Review Labs Reviewed  CBC WITH DIFFERENTIAL - Abnormal; Notable for the following:    WBC 12.2 (*)    Neutrophils Relative % 90 (*)    Neutro Abs 11.0 (*)    Lymphocytes Relative 5 (*)    Lymphs Abs 0.6 (*)    All other components within normal limits  COMPREHENSIVE METABOLIC PANEL - Abnormal; Notable for the following:    Potassium 3.6 (*)    Glucose, Bld 117 (*)    All other components within normal limits    Imaging Review Ct Maxillofacial Wo Cm  10/28/2014   CLINICAL DATA:  Facial trauma secondary to a fall created by a  seizure. Abrasions and swelling of the right side of the face.  EXAM: CT MAXILLOFACIAL WITHOUT CONTRAST  TECHNIQUE: Multidetector CT imaging of the maxillofacial structures was performed. Multiplanar CT image reconstructions were also generated. A small metallic BB was placed on the right temple in order to reliably differentiate right from left.  COMPARISON:  CT scan head dated 08/27/2014  FINDINGS: There are acute depressed fractures of and her wall ovary right maxillary sinus. There also fractures of the lateral, medial and posterior walls of the right maxillary sinus. There is a fracture of the right inferior orbital rim extending posteriorly through the orbital floor. There is no muscle entrapment or herniation of orbital contents into the maxillary sinus.  There is hemorrhage into the right maxillary sinus.  The fracture of the lateral wall of the sinus is along the superior margin of the alveolar ridge.  There is also a fracture of the right side of the hard palate which extends between the roots of tooth 4 and tooth 5 and and then extends posteriorly. The fracture does not cross the midline.  There is lucency around the roots of tooth numbers 6, 7 and 8 consistent with loosening.There are multiple dental caries.  IMPRESSION: 1. Extensive fractures involving all walls of the right maxillary sinus as well as the right inferior orbital rim and orbital floor. 2. Fracture of the right side of the hard palate. 3. Loosening of the roots of tooth numbers 6, 7, in a.   Electronically Signed   By: Geanie CooleyJim  Maxwell M.D.   On: 10/28/2014 14:45     EKG Interpretation None      MDM   Final diagnoses:  Seizure  Dental injury, subsequent encounter    Patient was observed in the emergency department.  He follows all commands.  His overall well-appearing.  Bleeding has resolved in his mouth.  Prior CT imaging was reviewed and fractures were noted.  In regards to the avulsion of tooth #8 it does appear to be placed  back in the socket at this time.  He will need to see the dentist first thing in the morning for additional care.  Seizure today likely breakthrough seizure.  Compliant with his medications.  His last dose of seizure medicines was taken this morning and he is currently out.  A prescription was written form at the outside hospital but he has not filled this at the pharmacy.  He'll be given 2000 mg of Keppra which is his nightly dose prior to discharge.  He will fill his Keppra prescription first thing in the morning. Parents would like referral to a new neurologist.  This was given.    Lyanne CoKevin M Angee Gupton, MD 10/28/14 2007

## 2014-10-28 NOTE — ED Notes (Signed)
Please call-[phone] to come pick up wheelchair and trash can belong to [facility]

## 2014-10-28 NOTE — ED Notes (Signed)
Pt presents to department for evaluation of seizure. [facility] and referred to dentist, pt went to dentist, but were unable to perform procedure due to altered LOC and bleeding inside mouth. Pt is lethargic upon arrival. Oral trauma noted upon arrival, bleeding from mouth. Currently taking Keppra at home. Mother at bedside.

## 2014-10-28 NOTE — ED Notes (Signed)
Pt removed from lsb. Face/hands washed with warm water.

## 2014-10-28 NOTE — Discharge Instructions (Signed)
Take Tylenol for pain.take the antibiotic as prescribed. Go to Dr.Benitez's office immediately upon leaving here to get tooth fixed. Call Dr.Bates's office tomorrow to be seen within one week for broken facial bones. Call Dr. Gerilyn Pilgr[place]oonquah tomorrow. He may want to see you in his office for adjustment of your seizure medications Seizure, Adult A seizure means there is unusual activity in the brain. A seizure can cause changes in attention or behavior. Seizures often cause shaking (convulsions). Seizures often last from 30 seconds to 2 minutes. HOME CARE   If you are given medicines, take them exactly as told by your doctor.  Keep all doctor visits as told.  Do not swim or drive until your doctor says it is okay.  Teach others what to do if you have a seizure. They should:  Lay you on the ground.  Put a cushion under your head.  Loosen any tight clothing around your neck.  Turn you on your side.  Stay with you until you get better. GET HELP RIGHT AWAY IF:   The seizure lasts longer than 2 to 5 minutes.  The seizure is very bad.  The person does not wake up after the seizure.  The person's attention or behavior changes. Drive the person to the emergency room or call your local emergency services (911 in [place] MAKE SURE YOU:   Understand these instructions.  Will watch your condition.  Will get help right away if you are not doing well or get worse. Document Released: 05/30/2008 Document Revised: 03/05/2012 Document Reviewed: 11/30/2011 [facility] Patient Information 2015 [employer], [place]. This information is not intended to replace advice given to you by your health care provider. Make sure you discuss any questions you have with your health care provider.

## 2014-11-28 ENCOUNTER — Emergency Department (HOSPITAL_COMMUNITY)
Admission: EM | Admit: 2014-11-28 | Discharge: 2014-11-28 | Disposition: A | Discharge status: Home / Self Care | Payer: No Typology Code available for payment source | Attending: Emergency Medicine | Admitting: Emergency Medicine

## 2014-11-28 ENCOUNTER — Encounter (HOSPITAL_COMMUNITY): Payer: Self-pay | Admitting: Emergency Medicine

## 2014-11-28 DIAGNOSIS — R569 Unspecified convulsions: Secondary | ICD-10-CM

## 2014-11-28 DIAGNOSIS — Z79899 Other long term (current) drug therapy: Secondary | ICD-10-CM | POA: Insufficient documentation

## 2014-11-28 DIAGNOSIS — Y9389 Activity, other specified: Secondary | ICD-10-CM | POA: Insufficient documentation

## 2014-11-28 DIAGNOSIS — Z792 Long term (current) use of antibiotics: Secondary | ICD-10-CM | POA: Insufficient documentation

## 2014-11-28 DIAGNOSIS — Y998 Other external cause status: Secondary | ICD-10-CM | POA: Insufficient documentation

## 2014-11-28 DIAGNOSIS — X58XXXA Exposure to other specified factors, initial encounter: Secondary | ICD-10-CM | POA: Insufficient documentation

## 2014-11-28 DIAGNOSIS — G40909 Epilepsy, unspecified, not intractable, without status epilepticus: Secondary | ICD-10-CM | POA: Insufficient documentation

## 2014-11-28 DIAGNOSIS — Y9289 Other specified places as the place of occurrence of the external cause: Secondary | ICD-10-CM | POA: Insufficient documentation

## 2014-11-28 DIAGNOSIS — S0081XA Abrasion of other part of head, initial encounter: Secondary | ICD-10-CM | POA: Insufficient documentation

## 2014-11-28 LAB — CBC WITH DIFFERENTIAL/PLATELET
BASOS ABS: 0 10*3/uL (ref 0.0–0.1)
Basophils Relative: 0 % (ref 0–1)
EOS ABS: 0.1 10*3/uL (ref 0.0–0.7)
EOS PCT: 2 % (ref 0–5)
HCT: 38.8 % — ABNORMAL LOW (ref 39.0–52.0)
Hemoglobin: 13.2 g/dL (ref 13.0–17.0)
LYMPHS ABS: 1.3 10*3/uL (ref 0.7–4.0)
Lymphocytes Relative: 43 % (ref 12–46)
MCH: 27 pg (ref 26.0–34.0)
MCHC: 34 g/dL (ref 30.0–36.0)
MCV: 79.3 fL (ref 78.0–100.0)
Monocytes Absolute: 0.2 10*3/uL (ref 0.1–1.0)
Monocytes Relative: 8 % (ref 3–12)
NEUTROS PCT: 47 % (ref 43–77)
Neutro Abs: 1.4 10*3/uL — ABNORMAL LOW (ref 1.7–7.7)
PLATELETS: 185 10*3/uL (ref 150–400)
RBC: 4.89 MIL/uL (ref 4.22–5.81)
RDW: 13.3 % (ref 11.5–15.5)
WBC: 3.1 10*3/uL — ABNORMAL LOW (ref 4.0–10.5)

## 2014-11-28 LAB — BASIC METABOLIC PANEL
ANION GAP: 11 (ref 5–15)
BUN: 10 mg/dL (ref 6–23)
CO2: 28 mEq/L (ref 19–32)
Calcium: 9.1 mg/dL (ref 8.4–10.5)
Chloride: 100 mEq/L (ref 96–112)
Creatinine, Ser: 1.2 mg/dL (ref 0.50–1.35)
GFR calc Af Amer: >90 mL/min (ref >90)
GFR, EST NON AFRICAN AMERICAN: 80 mL/min — AB (ref >90)
Glucose, Bld: 107 mg/dL — ABNORMAL HIGH (ref 70–99)
POTASSIUM: 4.1 meq/L (ref 3.7–5.3)
SODIUM: 139 meq/L (ref 137–147)

## 2014-11-28 MED ORDER — LEVETIRACETAM IN NACL 500 MG/100ML IV SOLN
500.0000 mg | Freq: Once | INTRAVENOUS | Status: AC
  Administered 2014-11-28: 500 mg via INTRAVENOUS
  Filled 2014-11-28: qty 100

## 2014-11-28 NOTE — Discharge Instructions (Signed)
Follow up with your md next week.   Return if promblems

## 2014-11-28 NOTE — ED Provider Notes (Signed)
CSN: [id number]     Arrival date & time 11/28/14  1612 History  This chart was scribed for Benny LennertJoseph L Kamorah Nevils, MD by Tonye RoyaltyJoshua Chen, ED Scribe. This patient was seen in room APA10/APA10 and the patient's care was started at 4:28 PM.    Chief Complaint  Patient presents with  . Seizures   Patient is a 28 y.o. male presenting with seizures. The history is provided by the patient. No language interpreter was used.  Seizures Seizure activity on arrival: no   Seizure type:  Unable to specify Preceding symptoms: no sensation of an aura present and no headache   Initial focality:  Unable to specify Episode characteristics: no incontinence and no tongue biting   Postictal symptoms: memory loss   Return to baseline: yes   Severity:  Mild Timing:  Once Number of seizures this episode:  1 Progression:  Resolved Context: decreased sleep   Recent head injury:  No recent head injuries PTA treatment:  None History of seizures: yes     HPI Comments: Dan LovettMichael D Mathews is a 28 y.o. male with history of seizures who presents to the Emergency Department complaining of likely seizure earlier today. He states he does not recall the seizure, but he was found on the floor by his co-workers afterwards. He states he slept fine last night and felt at baseline prior to the incident and he denies pain at this time. He states his last seizure was a few months ago and states he does not have seizures often; he states he has been compliant with his Keppra prescription.  Past Medical History  Diagnosis Date  . Seizures   . Seizures    History reviewed. No pertinent past surgical history. Family History  Problem Relation Age of Onset  . Hypertension Mother    History  Substance Use Topics  . Smoking status: Never Smoker   . Smokeless tobacco: Not on file  . Alcohol Use: No    Review of Systems  Constitutional: Negative for appetite change and fatigue.  HENT: Negative for congestion, ear discharge and sinus  pressure.   Eyes: Negative for discharge.  Respiratory: Negative for cough.   Cardiovascular: Negative for chest pain.  Gastrointestinal: Negative for abdominal pain and diarrhea.  Genitourinary: Negative for frequency and hematuria.  Musculoskeletal: Negative for back pain.  Skin: Negative for rash.  Neurological: Positive for seizures. Negative for headaches.  Psychiatric/Behavioral: Negative for hallucinations.      Allergies  Review of patient's allergies indicates no known allergies.  Home Medications   Prior to Admission medications   Medication Sig Start Date End Date Taking? Authorizing Provider  amoxicillin (AMOXIL) 500 MG capsule Take 1 capsule (500 mg total) by mouth 3 (three) times daily. 10/28/14   Doug SouSam Jacubowitz, MD  levETIRAcetam (KEPPRA) 1000 MG tablet Take 2,000 mg by mouth 2 (two) times daily.     Historical Provider, MD  tetrahydrozoline 0.05 % ophthalmic solution Place 1 drop into both eyes daily as needed (dry eyes).    Historical Provider, MD   BP 111/86 mmHg  Pulse 83  Temp(Src) 98 F (36.7 C) (Oral)  Resp 17  Ht 6' (1.829 m)  Wt 140 lb (63.504 kg)  BMI 18.98 kg/m2  SpO2 98% Physical Exam  Constitutional: He is oriented to person, place, and time. He appears well-developed.  HENT:  Head: Normocephalic.  Eyes: Conjunctivae and EOM are normal. No scleral icterus.  Neck: Neck supple. No thyromegaly present.  Cardiovascular: Normal rate and regular  rhythm.  Exam reveals no gallop and no friction rub.   No murmur heard. Pulmonary/Chest: No stridor. He has no wheezes. He has no rales. He exhibits no tenderness.  Abdominal: He exhibits no distension. There is no tenderness. There is no rebound.  Musculoskeletal: Normal range of motion. He exhibits no edema.  Lymphadenopathy:    He has no cervical adenopathy.  Neurological: He is oriented to person, place, and time. He exhibits normal muscle tone. Coordination normal.  Skin: No rash noted. No erythema.   Small abrasion above right eyebrow  Psychiatric: He has a normal mood and affect. His behavior is normal.  Nursing note and vitals reviewed.   ED Course  Procedures (including critical care time)  DIAGNOSTIC STUDIES: Oxygen Saturation is 98% on room air, normal by my interpretation.    COORDINATION OF CARE: 4:31 PM Discussed treatment plan with patient at beside, the patient agrees with the plan and has no further questions at this time.   Labs Review Labs Reviewed - No data to display  Imaging Review No results found.   EKG Interpretation None      MDM   Final diagnoses:  None   Sz.  Pt to follow up with neurologist next week.  Benny LennertJoseph L Rabab Currington, MD 11/28/14 [phone]

## 2014-11-28 NOTE — ED Notes (Signed)
Keppra stopped IV.

## 2014-11-28 NOTE — ED Notes (Signed)
Patient found on floor by co-workers. Does not remember what happened. H/o seizures. Arrives Alert/oriented x 4.

## 2015-01-26 ENCOUNTER — Encounter (HOSPITAL_COMMUNITY): Payer: Self-pay | Admitting: Emergency Medicine

## 2015-01-26 ENCOUNTER — Emergency Department (HOSPITAL_COMMUNITY)
Admission: EM | Admit: 2015-01-26 | Discharge: 2015-01-26 | Disposition: A | Discharge status: Home / Self Care | Payer: No Typology Code available for payment source | Attending: Emergency Medicine | Admitting: Emergency Medicine

## 2015-01-26 DIAGNOSIS — G40909 Epilepsy, unspecified, not intractable, without status epilepticus: Secondary | ICD-10-CM | POA: Insufficient documentation

## 2015-01-26 DIAGNOSIS — R51 Headache: Secondary | ICD-10-CM | POA: Insufficient documentation

## 2015-01-26 DIAGNOSIS — R569 Unspecified convulsions: Secondary | ICD-10-CM | POA: Diagnosis present

## 2015-01-26 DIAGNOSIS — Z79899 Other long term (current) drug therapy: Secondary | ICD-10-CM | POA: Diagnosis not present

## 2015-01-26 DIAGNOSIS — R5383 Other fatigue: Secondary | ICD-10-CM | POA: Insufficient documentation

## 2015-01-26 LAB — CBG MONITORING, ED: GLUCOSE-CAPILLARY: 103 mg/dL — AB (ref 70–99)

## 2015-01-26 MED ORDER — LORAZEPAM 1 MG PO TABS
1.0000 mg | ORAL_TABLET | Freq: Once | ORAL | Status: AC
  Administered 2015-01-26: 1 mg via ORAL
  Filled 2015-01-26: qty 1

## 2015-01-26 NOTE — Discharge Instructions (Signed)
Please be aware you may have another seizure ° °Do not drive until seen by your physician for your condition ° °Do not climb ladders/roofs/trees as a seizure can occur at that height and cause serious harm ° °Do not bathe/swim alone as a seizure can occur and cause serious harm ° °Please followup with your physician or neurologist for further testing and possible treatment ° ° °Seizure, Adult °A seizure is abnormal electrical activity in the brain. Seizures usually last from 30 seconds to 2 minutes. There are various types of seizures. °Before a seizure, you may have a warning sensation (aura) that a seizure is about to occur. An aura may include the following symptoms:  °· Fear or anxiety. °· Nausea. °· Feeling like the room is spinning (vertigo). °· Vision changes, such as seeing flashing lights or spots. °Common symptoms during a seizure include: °· A change in attention or behavior (altered mental status). °· Convulsions with rhythmic jerking movements. °· Drooling. °· Rapid eye movements. °· Grunting. °· Loss of bladder and bowel control. °· Bitter taste in the mouth. °· Tongue biting. °After a seizure, you may feel confused and sleepy. You may also have an injury resulting from convulsions during the seizure. °HOME CARE INSTRUCTIONS  °· If you are given medicines, take them exactly as prescribed by your health care provider. °· Keep all follow-up appointments as directed by your health care provider. °· Do not swim or drive or engage in risky activity during which a seizure could cause further injury to you or others until your health care provider says it is OK. °· Get adequate rest. °· Teach friends and family what to do if you have a seizure. They should: °¨ Lay you on the ground to prevent a fall. °¨ Put a cushion under your head. °¨ Loosen any tight clothing around your neck. °¨ Turn you on your side. If vomiting occurs, this helps keep your airway clear. °¨ Stay with you until you recover. °¨ Know  whether or not you need emergency care. °SEEK IMMEDIATE MEDICAL CARE IF: °· The seizure lasts longer than 5 minutes. °· The seizure is severe or you do not wake up immediately after the seizure. °· You have an altered mental status after the seizure. °· You are having more frequent or worsening seizures. °Someone should drive you to the emergency department or call local emergency services (911 in [place] °MAKE SURE YOU: °· Understand these instructions. °· Will watch your condition. °· Will get help right away if you are not doing well or get worse. °Document Released: 12/09/2000 Document Revised: 10/02/2013 Document Reviewed: 07/24/2013 °[employer]® Patient Information ©2015 [employer]. This information is not intended to replace advice given to you by your health care provider. Make sure you discuss any questions you have with your health care provider. ° °
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
## 2015-02-22 ENCOUNTER — Encounter (HOSPITAL_COMMUNITY): Payer: Self-pay | Admitting: *Deleted

## 2015-02-22 ENCOUNTER — Emergency Department (HOSPITAL_COMMUNITY)
Admission: EM | Admit: 2015-02-22 | Discharge: 2015-02-22 | Disposition: A | Discharge status: Home / Self Care | Payer: No Typology Code available for payment source | Attending: Emergency Medicine | Admitting: Emergency Medicine

## 2015-02-22 ENCOUNTER — Emergency Department (HOSPITAL_COMMUNITY): Payer: No Typology Code available for payment source

## 2015-02-22 DIAGNOSIS — G40909 Epilepsy, unspecified, not intractable, without status epilepticus: Secondary | ICD-10-CM | POA: Diagnosis not present

## 2015-02-22 DIAGNOSIS — X58XXXA Exposure to other specified factors, initial encounter: Secondary | ICD-10-CM | POA: Insufficient documentation

## 2015-02-22 DIAGNOSIS — Z79899 Other long term (current) drug therapy: Secondary | ICD-10-CM | POA: Diagnosis not present

## 2015-02-22 DIAGNOSIS — S0081XA Abrasion of other part of head, initial encounter: Secondary | ICD-10-CM | POA: Diagnosis not present

## 2015-02-22 DIAGNOSIS — Y9389 Activity, other specified: Secondary | ICD-10-CM | POA: Diagnosis not present

## 2015-02-22 DIAGNOSIS — S0031XA Abrasion of nose, initial encounter: Secondary | ICD-10-CM | POA: Insufficient documentation

## 2015-02-22 DIAGNOSIS — Y998 Other external cause status: Secondary | ICD-10-CM | POA: Insufficient documentation

## 2015-02-22 DIAGNOSIS — R569 Unspecified convulsions: Secondary | ICD-10-CM

## 2015-02-22 DIAGNOSIS — S00511A Abrasion of lip, initial encounter: Secondary | ICD-10-CM | POA: Insufficient documentation

## 2015-02-22 DIAGNOSIS — Y92481 Parking lot as the place of occurrence of the external cause: Secondary | ICD-10-CM | POA: Diagnosis not present

## 2015-02-22 LAB — CBC WITH DIFFERENTIAL/PLATELET
BASOS ABS: 0 10*3/uL (ref 0.0–0.1)
BASOS PCT: 0 % (ref 0–1)
EOS PCT: 2 % (ref 0–5)
Eosinophils Absolute: 0.1 10*3/uL (ref 0.0–0.7)
HCT: 39 % (ref 39.0–52.0)
Hemoglobin: 13.5 g/dL (ref 13.0–17.0)
LYMPHS PCT: 26 % (ref 12–46)
Lymphs Abs: 1.1 10*3/uL (ref 0.7–4.0)
MCH: 27.6 pg (ref 26.0–34.0)
MCHC: 34.6 g/dL (ref 30.0–36.0)
MCV: 79.6 fL (ref 78.0–100.0)
Monocytes Absolute: 0.4 10*3/uL (ref 0.1–1.0)
Monocytes Relative: 8 % (ref 3–12)
Neutro Abs: 2.9 10*3/uL (ref 1.7–7.7)
Neutrophils Relative %: 64 % (ref 43–77)
PLATELETS: 186 10*3/uL (ref 150–400)
RBC: 4.9 MIL/uL (ref 4.22–5.81)
RDW: 12.8 % (ref 11.5–15.5)
WBC: 4.5 10*3/uL (ref 4.0–10.5)

## 2015-02-22 LAB — COMPREHENSIVE METABOLIC PANEL
ALBUMIN: 4.1 g/dL (ref 3.5–5.2)
ALT: 12 U/L (ref 0–53)
ANION GAP: 2 — AB (ref 5–15)
AST: 18 U/L (ref 0–37)
Alkaline Phosphatase: 65 U/L (ref 39–117)
BILIRUBIN TOTAL: 0.9 mg/dL (ref 0.3–1.2)
BUN: 9 mg/dL (ref 6–23)
CALCIUM: 8.5 mg/dL (ref 8.4–10.5)
CO2: 29 mmol/L (ref 19–32)
CREATININE: 1.08 mg/dL (ref 0.50–1.35)
Chloride: 103 mmol/L (ref 96–112)
GFR calc Af Amer: >90 mL/min (ref >90)
Glucose, Bld: 113 mg/dL — ABNORMAL HIGH (ref 70–99)
Potassium: 3.6 mmol/L (ref 3.5–5.1)
Sodium: 134 mmol/L — ABNORMAL LOW (ref 135–145)
Total Protein: 6.7 g/dL (ref 6.0–8.3)

## 2015-02-22 LAB — ETHANOL

## 2015-02-22 MED ORDER — LACOSAMIDE 50 MG PO TABS: 50.0000 mg | ORAL_TABLET | Freq: Two times a day (BID) | ORAL | Status: DC
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
## 2015-02-22 NOTE — ED Notes (Signed)
EKG done and given to Dr Rancour 

## 2015-02-22 NOTE — Discharge Instructions (Signed)
Epilepsy Take the new seizure medicine as prescribed. Follow-up with your neurologist this week. Do not drive or operate heavy machinery until cleared by her neurologist. Return to the ED with new or worsening symptoms. Epilepsy is a disorder in which a person has repeated seizures over time. A seizure is a release of abnormal electrical activity in the brain. Seizures can cause a change in attention, behavior, or the ability to remain awake and alert (altered mental status). Seizures often involve uncontrollable shaking (convulsions).  Most people with epilepsy lead normal lives. However, people with epilepsy are at an increased risk of falls, accidents, and injuries. Therefore, it is important to begin treatment right away. CAUSES  Epilepsy has many possible causes. Anything that disturbs the normal pattern of brain cell activity can lead to seizures. This may include:   Head injury.  Birth trauma.  High fever as a child.  Stroke.  Bleeding into or around the brain.  Certain drugs.  Prolonged low oxygen, such as what occurs after CPR efforts.  Abnormal brain development.  Certain illnesses, such as meningitis, encephalitis (brain infection), malaria, and other infections.  An imbalance of nerve signaling chemicals (neurotransmitters).  SIGNS AND SYMPTOMS  The symptoms of a seizure can vary greatly from one person to another. Right before a seizure, you may have a warning (aura) that a seizure is about to occur. An aura may include the following symptoms:  Fear or anxiety.  Nausea.  Feeling like the room is spinning (vertigo).  Vision changes, such as seeing flashing lights or spots. Common symptoms during a seizure include:  Abnormal sensations, such as an abnormal smell or a bitter taste in the mouth.   Sudden, general body stiffness.   Convulsions that involve rhythmic jerking of the face, arm, or leg on one or both sides.   Sudden change in consciousness.    Appearing to be awake but not responding.   Appearing to be asleep but cannot be awakened.   Grimacing, chewing, lip smacking, drooling, tongue biting, or loss of bowel or bladder control. After a seizure, you may feel sleepy for a while. DIAGNOSIS  Your health care provider will ask about your symptoms and take a medical history. Descriptions from any witnesses to your seizures will be very helpful in the diagnosis. A physical exam, including a detailed neurological exam, is necessary. Various tests may be done, such as:   An electroencephalogram (EEG). This is a painless test of your brain waves. In this test, a diagram is created of your brain waves. These diagrams can be interpreted by a specialist.  An MRI of the brain.   A CT scan of the brain.   A spinal tap (lumbar puncture, LP).  Blood tests to check for signs of infection or abnormal blood chemistry. TREATMENT  There is no cure for epilepsy, but it is generally treatable. Once epilepsy is diagnosed, it is important to begin treatment as soon as possible. For most people with epilepsy, seizures can be controlled with medicines. The following may also be used:  A pacemaker for the brain (vagus nerve stimulator) can be used for people with seizures that are not well controlled by medicine.  Surgery on the brain. For some people, epilepsy eventually goes away. HOME CARE INSTRUCTIONS   Follow your health care provider's recommendations on driving and safety in normal activities.  Get enough rest. Lack of sleep can cause seizures.  Only take over-the-counter or prescription medicines as directed by your health care provider.  Take any prescribed medicine exactly as directed.  Avoid any known triggers of your seizures.  Keep a seizure diary. Record what you recall about any seizure, especially any possible trigger.   Make sure the people you live and work with know that you are prone to seizures. They should receive  instructions on how to help you. In general, a witness to a seizure should:   Cushion your head and body.   Turn you on your side.   Avoid unnecessarily restraining you.   Not place anything inside your mouth.   Call for emergency medical help if there is any question about what has occurred.   Follow up with your health care provider as directed. You may need regular blood tests to monitor the levels of your medicine.  SEEK MEDICAL CARE IF:   You develop signs of infection or other illness. This might increase the risk of a seizure.   You seem to be having more frequent seizures.   Your seizure pattern is changing.  SEEK IMMEDIATE MEDICAL CARE IF:   You have a seizure that does not stop after a few moments.   You have a seizure that causes any difficulty in breathing.   You have a seizure that results in a very severe headache.   You have a seizure that leaves you with the inability to speak or use a part of your body.  Document Released: 12/12/2005 Document Revised: 10/02/2013 Document Reviewed: 07/24/2013 [facility] Patient Information 2015 [employer], [place]. This information is not intended to replace advice given to you by your health care provider. Make sure you discuss any questions you have with your health care provider.

## 2015-09-26 ENCOUNTER — Emergency Department (HOSPITAL_COMMUNITY)
Admission: EM | Admit: 2015-09-26 | Discharge: 2015-09-26 | Disposition: A | Discharge status: Home / Self Care | Payer: No Typology Code available for payment source | Attending: Emergency Medicine | Admitting: Emergency Medicine

## 2015-09-26 ENCOUNTER — Encounter (HOSPITAL_COMMUNITY): Payer: Self-pay | Admitting: Emergency Medicine

## 2015-09-26 DIAGNOSIS — J3489 Other specified disorders of nose and nasal sinuses: Secondary | ICD-10-CM | POA: Insufficient documentation

## 2015-09-26 DIAGNOSIS — G40909 Epilepsy, unspecified, not intractable, without status epilepticus: Secondary | ICD-10-CM | POA: Insufficient documentation

## 2015-09-26 DIAGNOSIS — H9202 Otalgia, left ear: Secondary | ICD-10-CM | POA: Insufficient documentation

## 2015-09-26 DIAGNOSIS — Z79899 Other long term (current) drug therapy: Secondary | ICD-10-CM | POA: Diagnosis not present

## 2015-09-26 MED ORDER — FLUTICASONE PROPIONATE 50 MCG/ACT NA SUSP: 2.0000 | Freq: Every day | NASAL | Status: DC

## 2015-09-26 MED ORDER — IBUPROFEN 600 MG PO TABS: 600.0000 mg | ORAL_TABLET | Freq: Four times a day (QID) | ORAL | Status: DC | PRN

## 2015-09-26 MED ORDER — LORATADINE 10 MG PO TABS: 10.0000 mg | ORAL_TABLET | Freq: Every day | ORAL | Status: DC

## 2015-09-26 NOTE — ED Notes (Signed)
Discharge instructions given, pt demonstrated teach back and verbal understanding. No concerns voiced.  

## 2015-09-26 NOTE — ED Notes (Signed)
Pt states he has put "sweet oil drops" in his left ear tonight and this has helped some. Pain remains constant at 2/10 while sitting increasing to 5/10 when laying down. Tactile pressure to neck and behind ear increase pain.

## 2015-09-26 NOTE — ED Notes (Signed)
Pt states that he has been having pain in his left ear for about 3 days

## 2015-09-26 NOTE — Discharge Instructions (Signed)
Otalgia Pain from the middle ear is usually caused by a build-up of fluid and pressure behind the eardrum. Pain from an earache can be sharp, dull, or burning. The pain may be temporary or constant. The middle ear is connected to the nasal passages by a short narrow tube called the Eustachian tube. The Eustachian tube allows fluid to drain out of the middle ear, and helps keep the pressure in your ear equalized. CAUSES  A cold or allergy can block the Eustachian tube with inflammation and the build-up of secretions. This is especially likely in small children, because their Eustachian tube is shorter and more horizontal. When the Eustachian tube closes, the normal flow of fluid from the middle ear is stopped. Fluid can accumulate and cause stuffiness, pain, hearing loss, and an ear infection if germs start growing in this area. SYMPTOMS  The symptoms of an ear infection may include fever, ear pain, fussiness, increased crying, and irritability. Many children will have temporary and minor hearing loss during and right after an ear infection. Permanent hearing loss is rare, but the risk increases the more infections a child has. Other causes of ear pain include retained water in the outer ear canal from swimming and bathing. Ear pain in adults is less likely to be from an ear infection. Ear pain may be referred from other locations. Referred pain may be from the joint between your jaw and the skull. It may also come from a tooth problem or problems in the neck. Other causes of ear pain include:  A foreign body in the ear.  Outer ear infection.  Sinus infections.  Impacted ear wax.  Ear injury.  Arthritis of the jaw or TMJ problems.  Middle ear infection.  Tooth infections.  Sore throat with pain to the ears. DIAGNOSIS  Your caregiver can usually make the diagnosis by examining you. Sometimes other special studies, including x-rays and lab work may be necessary. TREATMENT   If antibiotics  were prescribed, use them as directed and finish them even if you or your child's symptoms seem to be improved.  Sometimes PE tubes are needed in children. These are little plastic tubes which are put into the eardrum during a simple surgical procedure. They allow fluid to drain easier and allow the pressure in the middle ear to equalize. This helps relieve the ear pain caused by pressure changes. HOME CARE INSTRUCTIONS   Only take over-the-counter or prescription medicines for pain, discomfort, or fever as directed by your caregiver. DO NOT GIVE CHILDREN ASPIRIN because of the association of Reye's Syndrome in children taking aspirin.  Use a cold pack applied to the outer ear for 15-20 minutes, 03-04 times per day or as needed may reduce pain. Do not apply ice directly to the skin. You may cause frost bite.  Over-the-counter ear drops used as directed may be effective. Your caregiver may sometimes prescribe ear drops.  Resting in an upright position may help reduce pressure in the middle ear and relieve pain.  Ear pain caused by rapidly descending from high altitudes can be relieved by swallowing or chewing gum. Allowing infants to suck on a bottle during airplane travel can help.  Do not smoke in the house or near children. If you are unable to quit smoking, smoke outside.  Control allergies. SEEK IMMEDIATE MEDICAL CARE IF:   You or your child are becoming sicker.  Pain or fever relief is not obtained with medicine.  You or your child's symptoms (pain, fever, or  irritability) do not improve within 24 to 48 hours or as instructed.  Severe pain suddenly stops hurting. This may indicate a ruptured eardrum.  You or your children develop new problems such as severe headaches, stiff neck, difficulty swallowing, or swelling of the face or around the ear. Document Released: 07/29/2004 Document Revised: 03/05/2012 Document Reviewed: 12/03/2008 [facility] Patient Information 2015 [place], [place].  This information is not intended to replace advice given to you by your health care provider. Make sure you discuss any questions you have with your health care provider.

## 2015-09-26 NOTE — ED Provider Notes (Signed)
CSN: [id number]     Arrival date & time 09/26/15  0241 History   First MD Initiated Contact with Patient 09/26/15 0246     Chief Complaint  Patient presents with  . Otalgia     (Consider location/radiation/quality/duration/timing/severity/associated sxs/prior Treatment) HPI  This is a 29 year old male who presents with left otalgia. Patient reports 2 to three-day history of worsening left ear pain. States the pain is worse at night. Currently he is pain-free. Denies any congestion or runny nose but does report sinus pressure. Denies any fevers. He has not taken anything for the pain. He has used sweet oil drops.  Past Medical History  Diagnosis Date  . Seizures (HCC)   . Seizures [facility])    Past Surgical History  Procedure Laterality Date  . Dental surgery     Family History  Problem Relation Age of Onset  . Hypertension Mother    Social History  Substance Use Topics  . Smoking status: Never Smoker   . Smokeless tobacco: None  . Alcohol Use: No    Review of Systems  Constitutional: Negative for fever.  HENT: Positive for ear pain and sinus pressure. Negative for congestion and ear discharge.   All other systems reviewed and are negative.     Allergies  Review of patient's allergies indicates no known allergies.  Home Medications   Prior to Admission medications   Medication Sig Start Date End Date Taking? Authorizing Provider  lacosamide (VIMPAT) 50 MG TABS tablet Take 1 tablet (50 mg total) by mouth 2 (two) times daily. 02/22/15  Yes Glynn Octave, MD  levETIRAcetam (KEPPRA) 1000 MG tablet Take 2,000 mg by mouth 2 (two) times daily.    Yes Historical Provider, MD  fluticasone (FLONASE) 50 MCG/ACT nasal spray Place 2 sprays into both nostrils daily. 09/26/15   Shon Baton, MD  ibuprofen (ADVIL,MOTRIN) 600 MG tablet Take 1 tablet (600 mg total) by mouth every 6 (six) hours as needed. 09/26/15   Shon Baton, MD  loratadine (CLARITIN) 10 MG tablet Take 1  tablet (10 mg total) by mouth daily. 09/26/15   Shon Baton, MD   BP 113/79 mmHg  Pulse 54  Temp(Src) 98.1 F (36.7 C) (Oral)  Resp 18  Ht 6' (1.829 m)  Wt 145 lb (65.772 kg)  BMI 19.66 kg/m2  SpO2 98% Physical Exam  Constitutional: He is oriented to person, place, and time. He appears well-developed and well-nourished. No distress.  HENT:  Head: Normocephalic and atraumatic.  Right Ear: External ear normal.  Left Ear: External ear normal.  Mouth/Throat: Oropharynx is clear and moist. No oropharyngeal exudate.  No tenderness to palpation of the sinuses, no mastoid erythema or tenderness  Eyes: Pupils are equal, round, and reactive to light.  Neck: Neck supple.  Cardiovascular: Normal rate, regular rhythm and normal heart sounds.   Pulmonary/Chest: Effort normal and breath sounds normal. No respiratory distress. He has no wheezes.  Abdominal: Soft.  Musculoskeletal: He exhibits no edema.  Neurological: He is alert and oriented to person, place, and time.  Skin: Skin is warm and dry.  Psychiatric: He has a normal mood and affect.  Nursing note and vitals reviewed.   ED Course  Procedures (including critical care time) Labs Review Labs Reviewed - No data to display  Imaging Review No results found. I have personally reviewed and evaluated these images and lab results as part of my medical decision-making.   EKG Interpretation None      MDM  Final diagnoses:  Otalgia, left   Ptient presents with left otalgia. Exam is unremarkable. No other infectious symptoms. Denies fevers. Does report sinus pressure. Exam is reassuring. Patient had may have a mild collection of fluid in the inner ear from allergies. Discussed supportive care with Flonase, nasal saline, Claritin. Ibuprofen for pain relief. Patient stated understanding.  After history, exam, and medical workup I feel the patient has been appropriately medically screened and is safe for discharge home. Pertinent  diagnoses were discussed with the patient. Patient was given return precautions.   Shon Baton, MD 09/26/15 [phone]

## 2015-11-05 ENCOUNTER — Emergency Department (HOSPITAL_COMMUNITY)
Admission: EM | Admit: 2015-11-05 | Discharge: 2015-11-05 | Disposition: A | Discharge status: Home / Self Care | Payer: No Typology Code available for payment source | Attending: Emergency Medicine | Admitting: Emergency Medicine

## 2015-11-05 ENCOUNTER — Encounter (HOSPITAL_COMMUNITY): Payer: Self-pay | Admitting: Emergency Medicine

## 2015-11-05 DIAGNOSIS — K047 Periapical abscess without sinus: Secondary | ICD-10-CM | POA: Diagnosis not present

## 2015-11-05 DIAGNOSIS — K029 Dental caries, unspecified: Secondary | ICD-10-CM | POA: Diagnosis not present

## 2015-11-05 DIAGNOSIS — Z79899 Other long term (current) drug therapy: Secondary | ICD-10-CM | POA: Diagnosis not present

## 2015-11-05 DIAGNOSIS — Z7951 Long term (current) use of inhaled steroids: Secondary | ICD-10-CM | POA: Insufficient documentation

## 2015-11-05 DIAGNOSIS — Z9889 Other specified postprocedural states: Secondary | ICD-10-CM | POA: Insufficient documentation

## 2015-11-05 DIAGNOSIS — G40909 Epilepsy, unspecified, not intractable, without status epilepticus: Secondary | ICD-10-CM | POA: Diagnosis not present

## 2015-11-05 DIAGNOSIS — K0889 Other specified disorders of teeth and supporting structures: Secondary | ICD-10-CM | POA: Diagnosis present

## 2015-11-05 MED ORDER — HYDROCODONE-ACETAMINOPHEN 5-325 MG PO TABS: 1.0000 | ORAL_TABLET | ORAL | Status: DC | PRN

## 2015-11-05 MED ORDER — CLINDAMYCIN HCL 150 MG PO CAPS
150.0000 mg | ORAL_CAPSULE | Freq: Once | ORAL | Status: AC
  Administered 2015-11-05: 150 mg via ORAL
  Filled 2015-11-05: qty 1

## 2015-11-05 MED ORDER — HYDROCODONE-ACETAMINOPHEN 5-325 MG PO TABS
1.0000 | ORAL_TABLET | Freq: Once | ORAL | Status: AC
  Administered 2015-11-05: 1 via ORAL
  Filled 2015-11-05: qty 1

## 2015-11-05 MED ORDER — CLINDAMYCIN HCL 150 MG PO CAPS: 150.0000 mg | ORAL_CAPSULE | Freq: Four times a day (QID) | ORAL | Status: DC

## 2015-11-05 NOTE — ED Notes (Signed)
Patient complaining of bottom left dental pain. Swelling noted to left side of face.

## 2015-11-05 NOTE — ED Provider Notes (Signed)
CSN: [id number]     Arrival date & time 11/05/15  1607 History   First MD Initiated Contact with Patient 11/05/15 1658     Chief Complaint  Patient presents with  . Dental Pain     (Consider location/radiation/quality/duration/timing/severity/associated sxs/prior Treatment) The history is provided by the patient.   Dan Mathews is a 29 y.o. male presenting with a 3 day history of left lower dental pain from a decayed tooth which has now developed into left jawline swelling. He denies drainage from the site and has had no fevers or chills. Pain is constant, throbbing and not improved with ibuprofen (took 1 tablet last night).  He does not have a dentist currently but has made several calls, has not found a dentist yet that accepts his insurance.     Past Medical History  Diagnosis Date  . Seizures (HCC)   . Seizures [facility])    Past Surgical History  Procedure Laterality Date  . Dental surgery     Family History  Problem Relation Age of Onset  . Hypertension Mother    Social History  Substance Use Topics  . Smoking status: Never Smoker   . Smokeless tobacco: None  . Alcohol Use: No    Review of Systems  Constitutional: Negative for fever.  HENT: Positive for dental problem. Negative for facial swelling and sore throat.   Respiratory: Negative for shortness of breath.   Musculoskeletal: Negative for neck pain and neck stiffness.      Allergies  Review of patient's allergies indicates no known allergies.  Home Medications   Prior to Admission medications   Medication Sig Start Date End Date Taking? Authorizing Provider  clindamycin (CLEOCIN) 150 MG capsule Take 1 capsule (150 mg total) by mouth every 6 (six) hours. 11/05/15   Burgess Amor, PA-C  fluticasone (FLONASE) 50 MCG/ACT nasal spray Place 2 sprays into both nostrils daily. 09/26/15   Shon Baton, MD  HYDROcodone-acetaminophen (NORCO/VICODIN) 5-325 MG tablet Take 1 tablet by mouth every 4 (four) hours  as needed. 11/05/15   Burgess Amor, PA-C  ibuprofen (ADVIL,MOTRIN) 600 MG tablet Take 1 tablet (600 mg total) by mouth every 6 (six) hours as needed. 09/26/15   Shon Baton, MD  lacosamide (VIMPAT) 50 MG TABS tablet Take 1 tablet (50 mg total) by mouth 2 (two) times daily. 02/22/15   Glynn Octave, MD  levETIRAcetam (KEPPRA) 1000 MG tablet Take 2,000 mg by mouth 2 (two) times daily.     Historical Provider, MD  loratadine (CLARITIN) 10 MG tablet Take 1 tablet (10 mg total) by mouth daily. 09/26/15   Shon Baton, MD   BP 109/66 mmHg  Pulse 78  Temp(Src) 98.4 F (36.9 C) (Oral)  Resp 18  Ht 6' (1.829 m)  Wt 140 lb (63.504 kg)  BMI 18.98 kg/m2  SpO2 100% Physical Exam  Constitutional: He is oriented to person, place, and time. He appears well-developed and well-nourished. No distress.  HENT:  Head: Normocephalic and atraumatic.  Right Ear: Tympanic membrane and external ear normal.  Left Ear: Tympanic membrane and external ear normal.  Mouth/Throat: Oropharynx is clear and moist and mucous membranes are normal. No oral lesions. No trismus in the jaw. Dental abscesses present.    Moderate soft swelling along left lower jaw.  No induration.  No identified abscess pocket.  Decay of left lower 2nd molar, occlusive surface.  Eyes: Conjunctivae are normal.  Neck: Normal range of motion. Neck supple.  Cardiovascular: Normal rate  and normal heart sounds.   Pulmonary/Chest: Effort normal.  Abdominal: He exhibits no distension.  Musculoskeletal: Normal range of motion.  Lymphadenopathy:    He has no cervical adenopathy.  Neurological: He is alert and oriented to person, place, and time.  Skin: Skin is warm and dry. No erythema.  Psychiatric: He has a normal mood and affect.    ED Course  Procedures (including critical care time) Labs Review Labs Reviewed - No data to display  Imaging Review No results found. I have personally reviewed and evaluated these images and lab  results as part of my medical decision-making.   EKG Interpretation None      MDM   Final diagnoses:  Dental abscess    Patient was prescribed clindamycin and hydrocodone.  First dose is given here.  He is encouraged to continue taking the ibuprofen but increase to 2 tablets every 6 hours.  Dental referral list given.  In the interim patient was given strict return instructions for any worsening symptoms.    Burgess AmorJulie Murial Beam, PA-C 11/05/15 1846  Glynn OctaveStephen Rancour, MD 11/06/15 [phone]

## 2015-11-05 NOTE — ED Notes (Signed)
Pt verbalized understanding of no driving and to use caution within 4 hours of taking pain meds due to meds cause drowsiness.  Instructed pt to take all of antibiotics as prescribed. 

## 2015-11-05 NOTE — Discharge Instructions (Signed)
Dental Abscess A dental abscess is a collection of pus in or around a tooth. CAUSES This condition is caused by a bacterial infection around the root of the tooth that involves the inner part of the tooth (pulp). It may result from:  Severe tooth decay.  Trauma to the tooth that allows bacteria to enter into the pulp, such as a broken or chipped tooth.  Severe gum disease around a tooth. SYMPTOMS Symptoms of this condition include:  Severe pain in and around the infected tooth.  Swelling and redness around the infected tooth, in the mouth, or in the face.  Tenderness.  Pus drainage.  Bad breath.  Bitter taste in the mouth.  Difficulty swallowing.  Difficulty opening the mouth.  Nausea.  Vomiting.  Chills.  Swollen neck glands.  Fever. DIAGNOSIS This condition is diagnosed with examination of the infected tooth. During the exam, your dentist may tap on the infected tooth. Your dentist will also ask about your medical and dental history and may order X-rays. TREATMENT This condition is treated by eliminating the infection. This may be done with:  Antibiotic medicine.  A root canal. This may be performed to save the tooth.  Pulling (extracting) the tooth. This may also involve draining the abscess. This is done if the tooth cannot be saved. HOME CARE INSTRUCTIONS  Take medicines only as directed by your dentist.  If you were prescribed antibiotic medicine, finish all of it even if you start to feel better.  Rinse your mouth (gargle) often with salt water to relieve pain or swelling.  Do not drive or operate heavy machinery while taking pain medicine.  Do not apply heat to the outside of your mouth.  Keep all follow-up visits as directed by your dentist. This is important. SEEK MEDICAL CARE IF:  Your pain is worse and is not helped by medicine. SEEK IMMEDIATE MEDICAL CARE IF:  You have a fever or chills.  Your symptoms suddenly get worse.  You have a  very bad headache.  You have problems breathing or swallowing.  You have trouble opening your mouth.  You have swelling in your neck or around your eye.   This information is not intended to replace advice given to you by your health care provider. Make sure you discuss any questions you have with your health care provider.   Document Released: 12/12/2005 Document Revised: 04/28/2015 Document Reviewed: 12/09/2014 Elsevier Interactive Patient Education 2016 [employer]   Complete your entire course of antibiotics as prescribed.  You  may use the hydrocodone for pain relief but do not drive within 4 hours of taking as this will make you drowsy.  Avoid applying heat or ice to this abscess area which can worsen your symptoms.  You may use warm salt water swish and spit treatment or half peroxide and water swish and spit after meals to keep this area clean as discussed.  Call the dentist listed above for further management of your symptoms.
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
## 2020-04-24 ENCOUNTER — Ambulatory Visit
Admission: EM | Admit: 2020-04-24 | Discharge: 2020-04-24 | Disposition: A | Discharge status: Home / Self Care | Payer: 59 | Attending: Emergency Medicine | Admitting: Emergency Medicine

## 2020-04-24 ENCOUNTER — Other Ambulatory Visit: Payer: Self-pay

## 2020-04-24 DIAGNOSIS — Z20822 Contact with and (suspected) exposure to covid-19: Secondary | ICD-10-CM

## 2020-04-24 NOTE — ED Provider Notes (Signed)
[facility]   [id number] 04/24/20 Arrival Time: 6283   CC: COVID exposure  SUBJECTIVE: History from: patient.  Dan Mathews is a 34 y.o. male who presents for COVID testing.  Patient's son's sister tested positive for COVID.  Denies recent travel.  Denies aggravating or alleviating symptoms.  Denies previous COVID infection.   Denies fever, chills, fatigue, nasal congestion, rhinorrhea, sore throat, cough, SOB, wheezing, chest pain, nausea, vomiting, changes in bowel or bladder habits.     ROS: As per HPI.  All other pertinent ROS negative.     Past Medical History:  Diagnosis Date  . Seizures (HCC)   . Seizures (HCC)    Past Surgical History:  Procedure Laterality Date  . DENTAL SURGERY     No Known Allergies No current facility-administered medications on file prior to encounter.   Current Outpatient Medications on File Prior to Encounter  Medication Sig Dispense Refill  . lacosamide (VIMPAT) 50 MG TABS tablet Take 1 tablet (50 mg total) by mouth 2 (two) times daily. 60 tablet 0  . levETIRAcetam (KEPPRA) 1000 MG tablet Take 2,000 mg by mouth 2 (two) times daily.     . [DISCONTINUED] fluticasone (FLONASE) 50 MCG/ACT nasal spray Place 2 sprays into both nostrils daily. 16 g 0  . [DISCONTINUED] loratadine (CLARITIN) 10 MG tablet Take 1 tablet (10 mg total) by mouth daily. 30 tablet 0   Social History   Socioeconomic History  . Marital status: Single    Spouse name: Not on file  . Number of children: Not on file  . Years of education: Not on file  . Highest education level: Not on file  Occupational History  . Not on file  Tobacco Use  . Smoking status: Never Smoker  . Smokeless tobacco: Never Used  Substance and Sexual Activity  . Alcohol use: No  . Drug use: No  . Sexual activity: Yes    Birth control/protection: None  Other Topics Concern  . Not on file  Social History Narrative  . Not on file   Social Determinants of Health   Financial  Resource Strain:   . Difficulty of Paying Living Expenses:   Food Insecurity:   . Worried About Programme researcher, broadcasting/film/video in the Last Year:   . Barista in the Last Year:   Transportation Needs:   . Freight forwarder (Medical):   Marland Kitchen Lack of Transportation (Non-Medical):   Physical Activity:   . Days of Exercise per Week:   . Minutes of Exercise per Session:   Stress:   . Feeling of Stress :   Social Connections:   . Frequency of Communication with Friends and Family:   . Frequency of Social Gatherings with Friends and Family:   . Attends Religious Services:   . Active Member of Clubs or Organizations:   . Attends Banker Meetings:   Marland Kitchen Marital Status:   Intimate Partner Violence:   . Fear of Current or Ex-Partner:   . Emotionally Abused:   Marland Kitchen Physically Abused:   . Sexually Abused:    Family History  Problem Relation Age of Onset  . Hypertension Mother   . Healthy Father     OBJECTIVE:  Vitals:   04/24/20 0938  BP: 119/74  Pulse: (!) 57  Resp: 17  Temp: 98.1 F (36.7 C)  TempSrc: Oral  SpO2: 98%     General appearance: alert; well-appearing, nontoxic; speaking in full sentences and tolerating own secretions HEENT:  NCAT; Ears: EACs clear, TMs pearly gray; Eyes: PERRL.  EOM grossly intact.Nose: nares patent without rhinorrhea, Throat: oropharynx clear, tonsils non erythematous or enlarged, uvula midline  Neck: supple without LAD Lungs: unlabored respirations, symmetrical air entry; cough: absent; no respiratory distress; CTAB Heart: regular rate and rhythm.  Skin: warm and dry Psychological: alert and cooperative; normal mood and affect    ASSESSMENT & PLAN:  1. Exposure to COVID-19 virus     COVID testing ordered.  It will take between 2-5 days for test results.  Someone will contact you regarding abnormal results.    In the meantime:  If you were to develop symptoms: You should remain isolated in your home for 10 days from symptom onset  AND greater than 72 hours after symptoms resolution (absence of fever without the use of fever-reducing medication and improvement in respiratory symptoms), whichever is longer If you have had exposure: you should remain in quarantine for 7 days from exposure.  However, if symptoms develop you must self isolate and return for retesting Get plenty of rest and push fluids Follow up with PCP as needed Call or go to the ED if you have any new symptoms such as fever, cough, shortness of breath, chest tightness, chest pain, turning blue, changes in mental status, etc...   Reviewed expectations re: course of current medical issues. Questions answered. Outlined signs and symptoms indicating need for more acute intervention. Patient verbalized understanding. After Visit Summary given.         Stacey Drain [place], PA-C 04/24/20 [phone]

## 2020-04-24 NOTE — ED Triage Notes (Signed)
Pt presents to have covid test done. Patients son's sister tested positive and they have both been around her. States being around her over the last few days. Denies any symptoms.

## 2020-04-24 NOTE — Discharge Instructions (Signed)

## 2020-04-25 LAB — NOVEL CORONAVIRUS, NAA: SARS-CoV-2, NAA: NOT DETECTED

## 2022-08-07 ENCOUNTER — Ambulatory Visit
Admission: EM | Admit: 2022-08-07 | Discharge: 2022-08-07 | Disposition: A | Discharge status: Home / Self Care | Payer: BLUE CROSS/BLUE SHIELD

## 2022-08-07 ENCOUNTER — Encounter: Payer: Self-pay | Admitting: Emergency Medicine

## 2022-08-07 DIAGNOSIS — M25532 Pain in left wrist: Secondary | ICD-10-CM | POA: Diagnosis not present

## 2022-08-07 NOTE — Discharge Instructions (Addendum)
-   The pain in your wrist is likely related to overuse -You can use Tylenol 500 to 1000 mg every 6 hours as needed for pain along with ice and elevation to help with wrist pain -Follow-up with orthopedic or primary care provider if wrist pain persists despite treatment

## 2022-08-07 NOTE — ED Provider Notes (Signed)
[facility]    CSN: [id number] Arrival date & time: 08/07/22  1212      History   Chief Complaint Chief Complaint  Patient presents with   Hand Problem    HPI Dan Mathews is a 36 y.o. male.   Patient presents with 2 days of left wrist pain.  Denies any recent fall, accident, injury, or trauma to the wrist.  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  Patient is right-handed.  Reports the pain stays on his wrist, moves into his hand, however denies any numbness or tingling to his fingertips.  Reports the pain comes and goes, currently is not in any pain.  Pain is worse with wrist movement, has not taken anything for the pain therefore does not know what makes it better.  Denies any weakness, numbness or tingling, redness, bruising, swelling, or fevers, nausea/vomiting.    Past Medical History:  Diagnosis Date   Seizures (HCC)    Seizures (HCC)     There are no problems to display for this patient.   Past Surgical History:  Procedure Laterality Date   DENTAL SURGERY         Home Medications    Prior to Admission medications   Medication Sig Start Date End Date Taking? Authorizing Provider  lacosamide (VIMPAT) 50 MG TABS tablet Take 1 tablet (50 mg total) by mouth 2 (two) times daily. 02/22/15  Yes Rancour, Jeannett Senior, MD  levETIRAcetam (KEPPRA) 1000 MG tablet Take 2,000 mg by mouth 2 (two) times daily.    Yes [provider]  fluticasone (FLONASE) 50 MCG/ACT nasal spray Place 2 sprays into both nostrils daily. 09/26/15 04/24/20  Horton, Mayer Masker, MD  loratadine (CLARITIN) 10 MG tablet Take 1 tablet (10 mg total) by mouth daily. 09/26/15 04/24/20  Horton, Mayer Masker, MD    Family History Family History  Problem Relation Age of Onset   Hypertension Mother    Healthy Father     Social History Social History   Tobacco Use   Smoking status: Never   Smokeless tobacco: Never   Substance Use Topics   Alcohol use: No   Drug use: No     Allergies   Patient has no known allergies.   Review of Systems Review of Systems Per HPI  Physical Exam Triage Vital Signs ED Triage Vitals  Enc Vitals Group     BP 08/07/22 1320 105/61     Pulse Rate 08/07/22 1320 (!) 53     Resp 08/07/22 1320 18     Temp 08/07/22 1320 97.9 F (36.6 C)     Temp Source 08/07/22 1320 Oral     SpO2 08/07/22 1320 97 %     Weight 08/07/22 1323 145 lb (65.8 kg)     Height 08/07/22 1323 6' (1.829 m)     Head Circumference --      Peak Flow --      Pain Score 08/07/22 1322 0     Pain Loc --      Pain Edu? --      Excl. in GC? --    No data found.  Updated Vital Signs BP 105/61 (BP Location: Right Arm)   Pulse (!) 53   Temp 97.9 F (36.6 C) (Oral)   Resp 18   Ht 6' (1.829 m)   Wt 145 lb (65.8 kg)   SpO2 97%   BMI 19.67 kg/m   Visual  Acuity Right Eye Distance:   Left Eye Distance:   Bilateral Distance:    Right Eye Near:   Left Eye Near:    Bilateral Near:     Physical Exam Vitals and nursing note reviewed.  Constitutional:      General: He is not in acute distress.    Appearance: Normal appearance. He is not toxic-appearing.  HENT:     Head: Normocephalic and atraumatic.     Mouth/Throat:     Mouth: Mucous membranes are moist.     Pharynx: Oropharynx is clear.  Pulmonary:     Effort: Pulmonary effort is normal. No respiratory distress.  Musculoskeletal:     Right wrist: Normal. No swelling, deformity, effusion, tenderness, bony tenderness or snuff box tenderness. Normal range of motion. Normal pulse.     Left wrist: Tenderness present. No swelling, deformity, effusion, bony tenderness or snuff box tenderness. Normal range of motion. Normal pulse.     Right hand: No tenderness or bony tenderness. Normal range of motion. Normal strength. Normal sensation. Normal capillary refill. Normal pulse.     Left hand: No tenderness or bony tenderness. Normal range of  motion. Normal strength. Normal sensation. Normal capillary refill. Normal pulse.       Hands:     Comments: Inspection: No swelling, obvious deformity, or redness to bilateral wrists Palpation: Left wrist tender to palpation areas marked; no obvious deformities palpated.  Right wrist nontender; no obvious deformities palpated.  ROM: Full ROM to bilateral wrists Strength: 5/5 bilateral upper extremities Neurovascular: neurovascularly intact in left and right upper extremity   Skin:    General: Skin is warm and dry.     Capillary Refill: Capillary refill takes less than 2 seconds.     Coloration: Skin is not jaundiced or pale.     Findings: No erythema.  Neurological:     Mental Status: He is alert and oriented to person, place, and time.  Psychiatric:        Behavior: Behavior is cooperative.      UC Treatments / Results  Labs (all labs ordered are listed, but only abnormal results are displayed) Labs Reviewed - No data to display  EKG   Radiology No results found.  Procedures Procedures (including critical care time)  Medications Ordered in UC Medications - No data to display  Initial Impression / Assessment and Plan / UC Course  I have reviewed the triage vital signs and the nursing notes.  Pertinent labs & imaging results that were available during my care of the patient were reviewed by me and considered in my medical decision making (see chart for details).    Patient is a very pleasant, well-appearing 36 year old male presenting for left wrist pain today.  No injury, x-ray imaging deferred.  Suspect pain is related to overuse at work, discussed use of Tylenol 500 to 1000 mg every 6 hours as needed for pain along with elevation and ice.  Over-the-counter compression brace to help with pain.  Recommended follow-up with primary care provider if symptoms persist or worsen despite treatment.  Note given for work. The patient was given the opportunity to ask questions.   All questions answered to their satisfaction.  The patient is in agreement to this plan.   Final Clinical Impressions(s) / UC Diagnoses   Final diagnoses:  Left wrist pain     Discharge Instructions      - The pain in your wrist is likely related to overuse -You can use Tylenol  500 to 1000 mg every 6 hours as needed for pain along with ice and elevation to help with wrist pain -Follow-up with orthopedic or primary care provider if wrist pain persists despite treatment     ED Prescriptions   None    PDMP not reviewed this encounter.   Valentino Nose, NP 08/07/22 1346

## 2022-08-07 NOTE — ED Triage Notes (Signed)
Patient c/o left hand/wrist pain x 2 days, no apparent injury.  No swelling.  Patient denies any OTC pain meds.

## 2022-09-04 ENCOUNTER — Other Ambulatory Visit: Payer: Self-pay

## 2022-09-04 ENCOUNTER — Emergency Department (HOSPITAL_COMMUNITY)
Admission: EM | Admit: 2022-09-04 | Discharge: 2022-09-04 | Disposition: A | Discharge status: Home / Self Care | Payer: BLUE CROSS/BLUE SHIELD | Attending: Emergency Medicine | Admitting: Emergency Medicine

## 2022-09-04 ENCOUNTER — Encounter (HOSPITAL_COMMUNITY): Payer: Self-pay | Admitting: Emergency Medicine

## 2022-09-04 DIAGNOSIS — H66001 Acute suppurative otitis media without spontaneous rupture of ear drum, right ear: Secondary | ICD-10-CM | POA: Diagnosis not present

## 2022-09-04 DIAGNOSIS — H9201 Otalgia, right ear: Secondary | ICD-10-CM | POA: Diagnosis not present

## 2022-09-04 MED ORDER — KETOROLAC TROMETHAMINE 60 MG/2ML IM SOLN
30.0000 mg | Freq: Once | INTRAMUSCULAR | Status: AC
  Administered 2022-09-04: 30 mg via INTRAMUSCULAR
  Filled 2022-09-04: qty 2

## 2022-09-04 MED ORDER — ACETAMINOPHEN 500 MG PO TABS
1000.0000 mg | ORAL_TABLET | Freq: Once | ORAL | Status: AC
  Administered 2022-09-04: 1000 mg via ORAL
  Filled 2022-09-04: qty 2

## 2022-09-04 MED ORDER — AMOXICILLIN-POT CLAVULANATE 875-125 MG PO TABS: 1.0000 | ORAL_TABLET | Freq: Two times a day (BID) | ORAL | 0 refills | Status: DC

## 2022-09-04 MED ORDER — AMOXICILLIN-POT CLAVULANATE 875-125 MG PO TABS
1.0000 | ORAL_TABLET | Freq: Once | ORAL | Status: AC
Start: 2022-09-04 — End: 2022-09-04
  Administered 2022-09-04: 1 via ORAL
  Filled 2022-09-04: qty 1

## 2022-09-04 NOTE — ED Provider Notes (Signed)
[facility] EMERGENCY DEPARTMENT Provider Note   CSN: [id number] Arrival date & time: 09/04/22  0410     History  Chief Complaint  Patient presents with   Headache    Dan Mathews is a 36 y.o. male.  37 year old male who presents the ER today secondary to a right-sided earache that is radiating up into his head.  Patient states it feels like a sinus infection has had in the past.  Been going on for a few days but got worse tonight not able to sleep because of it.  Has not tried thing for the discomfort.  No fevers.  No trismus.  No dental pain.  No other associated symptoms.   Headache      Home Medications Prior to Admission medications   Medication Sig Start Date End Date Taking? Authorizing Provider  amoxicillin-clavulanate (AUGMENTIN) 875-125 MG tablet Take 1 tablet by mouth every 12 (twelve) hours. 09/04/22  Yes Onyinyechi Huante, Barbara Cower, MD  lacosamide (VIMPAT) 50 MG TABS tablet Take 1 tablet (50 mg total) by mouth 2 (two) times daily. 02/22/15   Rancour, Jeannett Senior, MD  levETIRAcetam (KEPPRA) 1000 MG tablet Take 2,000 mg by mouth 2 (two) times daily.     [provider]  fluticasone (FLONASE) 50 MCG/ACT nasal spray Place 2 sprays into both nostrils daily. 09/26/15 04/24/20  Horton, Mayer Masker, MD  loratadine (CLARITIN) 10 MG tablet Take 1 tablet (10 mg total) by mouth daily. 09/26/15 04/24/20  Horton, Mayer Masker, MD      Allergies    Patient has no known allergies.    Review of Systems   Review of Systems  Neurological:  Positive for headaches.    Physical Exam Updated Vital Signs BP 109/79   Pulse 95   Temp 98.3 F (36.8 C) (Oral)   Resp 19   Ht 6' (1.829 m)   Wt 66 kg   SpO2 96%   BMI 19.73 kg/m  Physical Exam Vitals and nursing note reviewed.  Constitutional:      Appearance: He is well-developed.  HENT:     Head: Normocephalic and atraumatic.     Jaw: No trismus or tenderness.     Right Ear: A middle ear effusion is present. Tympanic membrane is  erythematous and bulging.  Cardiovascular:     Rate and Rhythm: Normal rate.  Pulmonary:     Effort: Pulmonary effort is normal. No respiratory distress.  Abdominal:     General: There is no distension.  Musculoskeletal:        General: Normal range of motion.     Cervical back: Normal range of motion.  Skin:    General: Skin is warm and dry.  Neurological:     Mental Status: He is alert.     ED Results / Procedures / Treatments   Labs (all labs ordered are listed, but only abnormal results are displayed) Labs Reviewed - No data to display  EKG None  Radiology No results found.  Procedures Procedures    Medications Ordered in ED Medications  amoxicillin-clavulanate (AUGMENTIN) 875-125 MG per tablet 1 tablet (has no administration in time range)  ketorolac (TORADOL) injection 30 mg (has no administration in time range)  acetaminophen (TYLENOL) tablet 1,000 mg (has no administration in time range)    ED Course/ Medical Decision Making/ A&P                           Medical Decision Making Risk OTC  drugs. Prescription drug management.   Likely Right AOM without complication. Multiple dental caries, needs dental follow up but no obvious infection. No neuro changes to suggest intracranial abnormalities or extension. Augmentin/tylenol/toradol here. Augmentin/ibuprofen at home.   Final Clinical Impression(s) / ED Diagnoses Final diagnoses:  Non-recurrent acute suppurative otitis media of right ear without spontaneous rupture of tympanic membrane    Rx / DC Orders ED Discharge Orders          Ordered    amoxicillin-clavulanate (AUGMENTIN) 875-125 MG tablet  Every 12 hours        09/04/22 0433              Isley Zinni, Barbara Cower, MD 09/04/22 [phone]

## 2022-09-04 NOTE — ED Triage Notes (Signed)
Pt with c/o R posterior head pain that radiates to Right ear since yesterday. Describes pain as "like a sinus pressure".

## 2022-09-23 ENCOUNTER — Encounter: Payer: Self-pay | Admitting: Neurology

## 2022-10-11 DIAGNOSIS — A084 Viral intestinal infection, unspecified: Secondary | ICD-10-CM | POA: Diagnosis not present

## 2022-10-11 DIAGNOSIS — Z6820 Body mass index (BMI) 20.0-20.9, adult: Secondary | ICD-10-CM | POA: Diagnosis not present

## 2022-10-11 DIAGNOSIS — G40909 Epilepsy, unspecified, not intractable, without status epilepticus: Secondary | ICD-10-CM | POA: Diagnosis not present

## 2022-10-26 ENCOUNTER — Ambulatory Visit: Payer: BLUE CROSS/BLUE SHIELD | Admitting: Neurology

## 2022-10-26 ENCOUNTER — Encounter: Payer: Self-pay | Admitting: Neurology

## 2022-10-26 VITALS — BP 106/73 | HR 68 | Ht 72.0 in | Wt 150.6 lb

## 2022-10-26 DIAGNOSIS — G40009 Localization-related (focal) (partial) idiopathic epilepsy and epileptic syndromes with seizures of localized onset, not intractable, without status epilepticus: Secondary | ICD-10-CM

## 2022-10-26 MED ORDER — LEVETIRACETAM 1000 MG PO TABS: ORAL_TABLET | ORAL | 3 refills | Status: DC

## 2022-10-26 MED ORDER — LACOSAMIDE 100 MG PO TABS: 100.0000 mg | ORAL_TABLET | Freq: Two times a day (BID) | ORAL | 3 refills | Status: DC

## 2022-10-26 NOTE — Progress Notes (Signed)
NEUROLOGY CONSULTATION NOTE  Dan Mathews MRN: [id number] DOB: 1986-05-24  Referring provider: Dr. Beryle Beams Primary care provider: none listed  Reason for consult:  establish care for seizures  Dear Dr Gerilyn Pilgrim:  Thank you for your kind referral of Dan Mathews for consultation of the above symptoms. Although his history is well known to you, please allow me to reiterate it for the purpose of our medical record. He is alone in the office today. Records and images were personally reviewed where available.  HISTORY OF PRESENT ILLNESS: This is a pleasant 36 year old right-handed man presenting to establish care for seizures as his prior neurologist Dr. Gerilyn Pilgrim is closing his practice. Records were reviewed. He had his first seizure in the 4th grade, there was no warning initially. Since then he has noticed that there would be a little intermittent ringing in his ears then he would blackout and wake up on the ground or hospital. He has had stapes on the back of his head from falls. No tongue bite or incontinence. He has been on seizure medications since childhood. He is on Levetiracetam 1000mg  1.5 tabs BID (1500mg  BID) and Lacosamide 100mg  BID with no seizures since 2016 (when Lacosamide was added). He denies any nocturnal seizures. No staring/unresponsive episodes, gaps in time, olfactory/gustatory hallucinations, deja vu, rising epigastric sensation, focal numbness/tingling/weakness, myoclonic jerks. He denies any headaches, dizziness, vision changes, back pain, bowel/bladder dysfunction. He has some neck stiffness from work. He works with , . He gets 8-9 hours of sleep. Memory and mood are good. He denies any side effects on medications. He does not drink alcohol. He lives with his mother and grandmother.   Epilepsy Risk Factors:  His maternal uncle and paternal baby brother have seizures. He had a normal birth and early development.  There is no  history of febrile convulsions, CNS infections such as meningitis/encephalitis, significant traumatic brain injury, neurosurgical procedures.  Prior ASMs: Dilantin Diagnostic Data: EEG at Dr. 2017 office in 10/2012 was normal Brain MRI done 10/2012 showed a left frontal 7 x 8 x 9 mm focal area of what appears to be encephalomalacia adjacent to the left frontal horn, may be related to remote insult such as prior infarct, prior infection, or prior trauma. There was note of diffusely prominent perivascular spaces, etiology indeterminate; there was a prominent vein superficial aspect of the medial left occipital lobe (does not have typical appearance of DVA).   PAST MEDICAL HISTORY: Past Medical History:  Diagnosis Date   Seizures (HCC)    Seizures (HCC)     PAST SURGICAL HISTORY: Past Surgical History:  Procedure Laterality Date   DENTAL SURGERY      MEDICATIONS: Current Outpatient Medications on File Prior to Visit  Medication Sig Dispense Refill   lacosamide (VIMPAT) 50 MG TABS tablet Take 1 tablet (50 mg total) by mouth 2 (two) times daily. 60 tablet 0   levETIRAcetam (KEPPRA) 1000 MG tablet Take 1,500 mg by mouth 2 (two) times daily. Takes 1.5 tablets BID     [DISCONTINUED] fluticasone (FLONASE) 50 MCG/ACT nasal spray Place 2 sprays into both nostrils daily. 16 g 0   [DISCONTINUED] loratadine (CLARITIN) 10 MG tablet Take 1 tablet (10 mg total) by mouth daily. 30 tablet 0   No current facility-administered medications on file prior to visit.    ALLERGIES: No Known Allergies  FAMILY HISTORY: Family History  Problem Relation Age of Onset   Hypertension Mother    Healthy Father  SOCIAL HISTORY: Social History   Socioeconomic History   Marital status: Single    Spouse name: Not on file   Number of children: Not on file   Years of education: Not on file   Highest education level: Not on file  Occupational History   Not on file  Tobacco Use   Smoking status:  Never   Smokeless tobacco: Never  Vaping Use   Vaping Use: Never used  Substance and Sexual Activity   Alcohol use: No   Drug use: No   Sexual activity: Yes    Birth control/protection: None  Other Topics Concern   Not on file  Social History Narrative   Are you right handed or left handed? Right handed    Are you currently employed ? yes   What is your current occupation? [employer]    Do you live at home alone? no   Who lives with you?  Mother and grandmother    What type of home do you live in: 1 story or 2 story? 2 story        Social Determinants of Radio broadcast assistant Strain: Not on file  Food Insecurity: Not on file  Transportation Needs: Not on file  Physical Activity: Not on file  Stress: Not on file  Social Connections: Not on file  Intimate Partner Violence: Not on file     PHYSICAL EXAM: Vitals:   10/26/22 1013  BP: 106/73  Pulse: 68  SpO2: 99%   General: No acute distress Head:  Normocephalic/atraumatic Skin/Extremities: No rash, no edema Neurological Exam: Mental status: alert and oriented to person, place, and time, no dysarthria or aphasia, Fund of knowledge is appropriate.  Recent and remote memory are intact, 2/3 delayed recall.  Attention and concentration are normal.     Cranial nerves: CN I: not tested CN II: pupils equal, round, visual fields intact CN III, IV, VI:  full range of motion, no nystagmus, no ptosis CN V: facial sensation intact CN VII: upper and lower face symmetric CN VIII: hearing intact to conversation Bulk & Tone: normal, no fasciculations. Motor: 5/5 throughout with no pronator drift. Sensation: intact to light touch, cold, pin, vibration  sense.  No extinction to double simultaneous stimulation.  Romberg test negative Deep Tendon Reflexes: +2 throughout Cerebellar: no incoordination on finger to nose testing Gait: narrow-based and steady, able to tandem walk adequately. 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 He reports auditory hallucinations prior to the seizures. MRI brain in 2013 showed a small focus of encephalomalacia in the left frontal subcortical region. EEG in 2013 normal. He has been seizure-free since 2016 since addition of Lacosamide 100mg  BID to Levetiracetam 1500mg  BID, continue current regimen.  [place] driving laws were discussed with the patient, and he knows to stop driving after a seizure, until 6 months seizure-free. Follow-up in 1 year, he knows to call for any changes.    Thank you for allowing me to participate in the care of this patient. Please do not hesitate to call for any questions or concerns.   Ellouise Newer, M.D.  CC: Dr. Merlene Laughter

## 2022-10-26 NOTE — Patient Instructions (Signed)
Good to meet you.  Continue Levetiracetam (Keppra) 1000mg : Take 1 and 1/2 tablets twice a day  2. Continue Lacosamide (Vimpat) 100mg : take 1 tablet twice a day  3. Follow-up in 1 year, call for any changes   Seizure Precautions: 1. If medication has been prescribed for you to prevent seizures, take it exactly as directed.  Do not stop taking the medicine without talking to your doctor first, even if you have not had a seizure in a long time.   2. Avoid activities in which a seizure would cause danger to yourself or to others.  Don't operate dangerous machinery, swim alone, or climb in high or dangerous places, such as on ladders, roofs, or girders.  Do not drive unless your doctor says you may.  3. If you have any warning that you may have a seizure, lay down in a safe place where you can't hurt yourself.    4.  No driving for 6 months from last seizure, as per [facility].   Please refer to the following link on the [place] website for more information: [email or web address]   5.  Maintain good sleep hygiene. Avoid alcohol.  6.  Contact your doctor if you have any problems that may be related to the medicine you are taking.  7.  Call 911 and bring the patient back to the ED if:        A.  The seizure lasts longer than 5 minutes.       B.  The patient doesn't awaken shortly after the seizure  C.  The patient has new problems such as difficulty seeing, speaking or moving  D.  The patient was injured during the seizure  E.  The patient has a temperature over 102 F (39C)  F.  The patient vomited and now is having trouble breathing

## 2023-02-01 DIAGNOSIS — Z6821 Body mass index (BMI) 21.0-21.9, adult: Secondary | ICD-10-CM | POA: Diagnosis not present

## 2023-02-01 DIAGNOSIS — G40909 Epilepsy, unspecified, not intractable, without status epilepticus: Secondary | ICD-10-CM | POA: Diagnosis not present

## 2023-02-01 DIAGNOSIS — R0981 Nasal congestion: Secondary | ICD-10-CM | POA: Diagnosis not present

## 2023-02-01 DIAGNOSIS — J329 Chronic sinusitis, unspecified: Secondary | ICD-10-CM | POA: Diagnosis not present

## 2023-04-26 ENCOUNTER — Telehealth: Payer: Self-pay | Admitting: Neurology

## 2023-04-26 ENCOUNTER — Other Ambulatory Visit: Payer: Self-pay | Admitting: Neurology

## 2023-04-26 NOTE — Telephone Encounter (Deleted)
Error

## 2023-05-29 ENCOUNTER — Ambulatory Visit (INDEPENDENT_AMBULATORY_CARE_PROVIDER_SITE_OTHER): Payer: BLUE CROSS/BLUE SHIELD

## 2023-05-29 ENCOUNTER — Ambulatory Visit
Admission: EM | Admit: 2023-05-29 | Discharge: 2023-05-29 | Disposition: A | Discharge status: Home / Self Care | Payer: BLUE CROSS/BLUE SHIELD | Attending: Nurse Practitioner | Admitting: Nurse Practitioner

## 2023-05-29 DIAGNOSIS — M25531 Pain in right wrist: Secondary | ICD-10-CM | POA: Diagnosis not present

## 2023-05-29 MED ORDER — PREDNISONE 20 MG PO TABS: 40.0000 mg | ORAL_TABLET | Freq: Every day | ORAL | 0 refills | Status: AC

## 2023-05-29 NOTE — Discharge Instructions (Signed)
The x-ray is negative for fracture or dislocation. Take medication as prescribed.  You may take over-the-counter Tylenol arthritis strength 650 mg tablets as needed for pain while you are taking the prednisone.  When she complete the prednisone, you can begin taking ibuprofen 600 to 800 mg (3 to 4 tablets) every 8 hours as needed for pain or discomfort. Gentle range of motion of the right wrist to help improve joint mobility. As discussed, if symptoms are not improving with this treatment, recommend following up with orthopedics for further evaluation. Follow-up as needed.

## 2023-05-29 NOTE — ED Provider Notes (Signed)
[facility]    CSN: [id number] Arrival date & time: 05/29/23  1032      History   Chief Complaint No chief complaint on file.   HPI Dan Mathews is a 37 y.o. male.   The history is provided by the patient.   The patient presents with a 3-day history of right wrist pain.  Patient denies injury or trauma.  Reports that he has pain when he turns his wrist "a certain way.  Patient denies numbness, tingling, swelling, or radiation of pain.  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  Patient has not taken any medication for his symptoms.  He is right-hand dominant.  Past Medical History:  Diagnosis Date   Seizures (HCC)    Seizures (HCC)     There are no problems to display for this patient.   Past Surgical History:  Procedure Laterality Date   DENTAL SURGERY         Home Medications    Prior to Admission medications   Medication Sig Start Date End Date Taking? Authorizing Provider  Lacosamide 100 MG TABS Take 1 tablet by mouth twice daily 04/26/23   Van Clines, MD  predniSONE (DELTASONE) 20 MG tablet Take 2 tablets (40 mg total) by mouth daily with breakfast for 5 days. 05/29/23 06/03/23 Yes Kwanza Cancelliere-Warren, Sadie Haber, NP  levETIRAcetam (KEPPRA) 1000 MG tablet Take 1 and 1/2 tablets twice a day 10/26/22   Van Clines, MD  fluticasone [facility]) 50 MCG/ACT nasal spray Place 2 sprays into both nostrils daily. 09/26/15 04/24/20  Horton, Mayer Masker, MD  loratadine (CLARITIN) 10 MG tablet Take 1 tablet (10 mg total) by mouth daily. 09/26/15 04/24/20  Horton, Mayer Masker, MD    Family History Family History  Problem Relation Age of Onset   Hypertension Mother    Healthy Father     Social History Social History   Tobacco Use   Smoking status: Never   Smokeless tobacco: Never  Vaping Use   Vaping Use: Never used  Substance  Use Topics   Alcohol use: No   Drug use: No     Allergies   Patient has no known allergies.   Review of Systems Review of Systems Per HPI  Physical Exam Triage Vital Signs ED Triage Vitals [05/29/23 1039]  Enc Vitals Group     BP 102/67     Pulse Rate 61     Resp 20     Temp 97.9 F (36.6 C)     Temp Source Oral     SpO2 97 %     Weight      Height      Head Circumference      Peak Flow      Pain Score 4     Pain Loc      Pain Edu?      Excl. in GC?    No data found.  Updated Vital Signs BP 102/67 (BP Location: Right Arm)   Pulse 61   Temp 97.9 F (36.6 C) (Oral)   Resp 20   SpO2 97%   Visual Acuity Right Eye Distance:   Left Eye Distance:   Bilateral Distance:    Right Eye Near:   Left Eye Near:    Bilateral Near:     Physical Exam Vitals  and nursing note reviewed.  Constitutional:      General: He is not in acute distress.    Appearance: Normal appearance.  HENT:     Head: Normocephalic.  Eyes:     Extraocular Movements: Extraocular movements intact.     Pupils: Pupils are equal, round, and reactive to light.  Pulmonary:     Effort: Pulmonary effort is normal.  Musculoskeletal:     Right wrist: Tenderness (tenderness noted to the dorsal aspect of the right wrist) present. No swelling or crepitus. Decreased range of motion (pain noted with movement in all planes). Normal pulse.     Cervical back: Normal range of motion.  Skin:    General: Skin is warm and dry.  Neurological:     General: No focal deficit present.     Mental Status: He is alert and oriented to person, place, and time.  Psychiatric:        Mood and Affect: Mood normal.        Behavior: Behavior normal.      UC Treatments / Results  Labs (all labs ordered are listed, but only abnormal results are displayed) Labs Reviewed - No data to display  EKG   Radiology DG Wrist Complete Right  Result Date: 05/29/2023 CLINICAL DATA:  Right wrist pain for 4 days. EXAM: RIGHT  WRIST - COMPLETE 3+ VIEW COMPARISON:  None Available. FINDINGS: There is no evidence of fracture or dislocation. There is no evidence of arthropathy or other focal bone abnormality. Soft tissues are unremarkable. IMPRESSION: Negative. Electronically Signed   By: Signa Kell M.D.   On: 05/29/2023 11:03    Procedures Procedures (including critical care time)  Medications Ordered in UC Medications - No data to display  Initial Impression / Assessment and Plan / UC Course  I have reviewed the triage vital signs and the nursing notes.  Pertinent labs & imaging results that were available during my care of the patient were reviewed by me and considered in my medical decision making (see chart for details).  The patient is well-appearing no acute distress vital signs are stable.  X-ray is negative for fracture or dislocation.  Suspect possible tendinitis of the right wrist at this time.  Will treat with prednisone 40 mg for the next 5 days.  Patient was also provided a wrist brace to provide compression and support of the right wrist.  Supportive care recommendations were provided and discussed with the patient to include over-the-counter analgesics such as Tylenol, RICE therapy, and gentle range of motion exercises.  Patient was advised that if symptoms do not improve with this treatment, recommend following up with orthopedics for further evaluation.  Patient was given information for Orth[facility] care of [place] and for [facility] for further evaluation if needed.  Patient was in agreement with this plan of care and verbalizes understanding.  All questions were answered.  Patient stable for discharge.   Final Clinical Impressions(s) / UC Diagnoses   Final diagnoses:  Right wrist pain     Discharge Instructions      The x-ray is negative for fracture or dislocation. Take medication as prescribed.  You may take over-the-counter Tylenol arthritis strength 650 mg tablets as needed for pain while  you are taking the prednisone.  When she complete the prednisone, you can begin taking ibuprofen 600 to 800 mg (3 to 4 tablets) every 8 hours as needed for pain or discomfort. Gentle range of motion of the right wrist to help improve joint  mobility. As discussed, if symptoms are not improving with this treatment, recommend following up with orthopedics for further evaluation. Follow-up as needed.     ED Prescriptions     Medication Sig Dispense Auth. Provider   predniSONE (DELTASONE) 20 MG tablet Take 2 tablets (40 mg total) by mouth daily with breakfast for 5 days. 10 tablet Evertt Chouinard-Warren, Sadie Haber, NP      PDMP not reviewed this encounter.   Abran Cantor, NP 05/29/23 1116

## 2023-05-29 NOTE — ED Triage Notes (Signed)
Pt reports he has right wrist pain x 4 days. Says he does a lot with his hands at work. Not sure what happened to it, but denies injury. Pain increases with ROM.

## 2023-09-19 DIAGNOSIS — Z6821 Body mass index (BMI) 21.0-21.9, adult: Secondary | ICD-10-CM | POA: Diagnosis not present

## 2023-09-19 DIAGNOSIS — G40909 Epilepsy, unspecified, not intractable, without status epilepticus: Secondary | ICD-10-CM | POA: Diagnosis not present

## 2023-09-19 DIAGNOSIS — Z9229 Personal history of other drug therapy: Secondary | ICD-10-CM | POA: Diagnosis not present

## 2023-09-19 DIAGNOSIS — Z1331 Encounter for screening for depression: Secondary | ICD-10-CM | POA: Diagnosis not present

## 2023-09-19 DIAGNOSIS — Z0001 Encounter for general adult medical examination with abnormal findings: Secondary | ICD-10-CM | POA: Diagnosis not present

## 2023-10-20 ENCOUNTER — Ambulatory Visit (INDEPENDENT_AMBULATORY_CARE_PROVIDER_SITE_OTHER): Payer: BLUE CROSS/BLUE SHIELD | Admitting: Neurology

## 2023-10-20 ENCOUNTER — Encounter: Payer: Self-pay | Admitting: Neurology

## 2023-10-20 VITALS — BP 100/67 | HR 56 | Ht 72.0 in | Wt 155.0 lb

## 2023-10-20 DIAGNOSIS — G40009 Localization-related (focal) (partial) idiopathic epilepsy and epileptic syndromes with seizures of localized onset, not intractable, without status epilepticus: Secondary | ICD-10-CM | POA: Diagnosis not present

## 2023-10-20 MED ORDER — LEVETIRACETAM 1000 MG PO TABS: ORAL_TABLET | ORAL | 4 refills | Status: DC

## 2023-10-20 MED ORDER — LACOSAMIDE 100 MG PO TABS: 1.0000 | ORAL_TABLET | Freq: Two times a day (BID) | ORAL | 3 refills | Status: DC

## 2023-10-20 NOTE — Patient Instructions (Signed)
Good to see you doing well. Continue all your medications. Follow-up in 1 year, call for any changes.    Seizure Precautions: 1. If medication has been prescribed for you to prevent seizures, take it exactly as directed.  Do not stop taking the medicine without talking to your doctor first, even if you have not had a seizure in a long time.   2. Avoid activities in which a seizure would cause danger to yourself or to others.  Don't operate dangerous machinery, swim alone, or climb in high or dangerous places, such as on ladders, roofs, or girders.  Do not drive unless your doctor says you may.  3. If you have any warning that you may have a seizure, lay down in a safe place where you can't hurt yourself.    4.  No driving for 6 months from last seizure, as per [facility].   Please refer to the following link on the [employer]ile[employer] of [employer]'s website for more information: http://www.epilepsyfoundation.org/answerplace/Social/driving/drivingu.cfm   5.  Maintain good sleep hygiene.  6.  Contact your doctor if you have any problems that may be related to the medicine you are taking.  7.  Call 911 and bring the patient back to the ED if:        A.  The seizure lasts longer than 5 minutes.       B.  The patient doesn't awaken shortly after the seizure  C.  The patient has new problems such as difficulty seeing, speaking or moving  D.  The patient was injured during the seizure  E.  The patient has a temperature over 102 F (39C)  F.  The patient vomited and now is having trouble breathing

## 2023-10-20 NOTE — Progress Notes (Signed)
NEUROLOGY FOLLOW UP OFFICE NOTE  JAMARRIE ANGELLO [id number] July 21, 1986  HISTORY OF PRESENT ILLNESS: I had the pleasure of seeing Nethanel Steltenpohl in follow-up in the neurology clinic on 10/20/2023.  The patient was last seen a year ago for seizures. He is alone in the office today. Records and images were personally reviewed where available.  Since his last visit, he continues to do well seizure-free since 2016 on Levetiracetam 1000mg  1.5 tabs BID (1500mg  BID) and Lacosamide 100mg  BID without side effects. He denies any staring/unresponsive episodes, gaps in time, auditory/olfactory/gustatory hallucinations, focal numbness/tingling/weakness, myoclonic jerks. He has had some sinus headaches. No dizziness, vision changes, no falls. He gets 8 hours of sleep. Mood is good.   History on Initial Assessment 10/26/2022: This is a pleasant 37 year old right-handed man presenting to establish care for seizures as his prior neurologist Dr. Gerilyn Pilgrim is closing his practice. Records were reviewed. He had his first seizure in the 4th grade, there was no warning initially. Since then he has noticed that there would be a little intermittent ringing in his ears then he would blackout and wake up on the ground or hospital. He has had staples on the back of his head from falls. No tongue bite or incontinence. He has been on seizure medications since childhood. He is on Levetiracetam 1000mg  1.5 tabs BID (1500mg  BID) and Lacosamide 100mg  BID with no seizures since 2016 (when Lacosamide was added). He denies any nocturnal seizures. No staring/unresponsive episodes, gaps in time, olfactory/gustatory hallucinations, deja vu, rising epigastric sensation, focal numbness/tingling/weakness, myoclonic jerks. He denies any headaches, dizziness, vision changes, back pain, bowel/bladder dysfunction. He has some neck stiffness from work. He works with [employer], Archivist. He gets 8-9 hours of sleep. Memory and mood are  good. He denies any side effects on medications. He does not drink alcohol. He lives with his mother and grandmother.   Epilepsy Risk Factors:  His maternal uncle and paternal baby brother have seizures. He had a normal birth and early development.  There is no history of febrile convulsions, CNS infections such as meningitis/encephalitis, significant traumatic brain injury, neurosurgical procedures.  Prior ASMs: Dilantin Diagnostic Data: EEG at Dr. Ronal Fear office in 10/2012 was normal Brain MRI done 10/2012 showed a left frontal 7 x 8 x 9 mm focal area of what appears to be encephalomalacia adjacent to the left frontal horn, may be related to remote insult such as prior infarct, prior infection, or prior trauma. There was note of diffusely prominent perivascular spaces, etiology indeterminate; there was a prominent vein superficial aspect of the medial left occipital lobe (does not have typical appearance of DVA).   PAST MEDICAL HISTORY: Past Medical History:  Diagnosis Date   Seizures (HCC)    Seizures (HCC)     MEDICATIONS: Current Outpatient Medications on File Prior to Visit  Medication Sig Dispense Refill   Lacosamide 100 MG TABS Take 1 tablet by mouth twice daily 60 tablet 5   levETIRAcetam (KEPPRA) 1000 MG tablet Take 1 and 1/2 tablets twice a day 270 tablet 3   [DISCONTINUED] fluticasone (FLONASE) 50 MCG/ACT nasal spray Place 2 sprays into both nostrils daily. 16 g 0   [DISCONTINUED] loratadine (CLARITIN) 10 MG tablet Take 1 tablet (10 mg total) by mouth daily. 30 tablet 0   No current facility-administered medications on file prior to visit.    ALLERGIES: No Known Allergies  FAMILY HISTORY: Family History  Problem Relation Age of Onset   Hypertension Mother  Healthy Father     SOCIAL HISTORY: Social History   Socioeconomic History   Marital status: Single    Spouse name: Not on file   Number of children: Not on file   Years of education: Not on file    Highest education level: Not on file  Occupational History   Not on file  Tobacco Use   Smoking status: Never   Smokeless tobacco: Never  Vaping Use   Vaping status: Never Used  Substance and Sexual Activity   Alcohol use: No   Drug use: No   Sexual activity: Yes    Birth control/protection: None  Other Topics Concern   Not on file  Social History Narrative   Are you right handed or left handed? Right handed    Are you currently employed ? yes   What is your current occupation? [employer]    Do you live at home alone? no   Who lives with you?  Mother and grandmother    What type of home do you live in: 1 story or 2 story? 2 story        Social Determinants of Corporate investment banker Strain: Not on file  Food Insecurity: Not on file  Transportation Needs: Not on file  Physical Activity: Not on file  Stress: Not on file  Social Connections: Not on file  Intimate Partner Violence: Not on file     PHYSICAL EXAM: Vitals:   10/20/23 0830  BP: 100/67  Pulse: (!) 56  SpO2: 99%   General: No acute distress Head:  Normocephalic/atraumatic Skin/Extremities: No rash, no edema Neurological Exam: alert and awake. No aphasia or dysarthria. Fund of knowledge is appropriate.  Attention and concentration are normal.   Cranial nerves: Pupils equal, round. Extraocular movements intact with no nystagmus. Visual fields full.  No facial asymmetry.  Motor: Bulk and tone normal, muscle strength 5/5 throughout with no pronator drift.   Finger to nose testing intact.  Gait narrow-based and steady, able to tandem walk adequately.  Romberg negative.   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 He reports auditory hallucinations prior to the seizures. MRI brain in 2013 showed a small focus of encephalomalacia in the left frontal subcortical region. EEG in 2013 normal. He  continues to do well seizure-free since 2016 on Levetiracetam 1500mg  BID (1000mg  1.5 tabs BID) and Lacosamide 100mg  BID, refills sent. We discussed avoidance of seizure triggers. He is aware of [place] driving laws to stop driving after a seizure until 6 months seizure-free. Follow-up in 1 year, call for any changes.    Thank you for allowing me to participate in his care.  Please do not hesitate to call for any questions or concerns.    Patrcia Dolly, M.D.

## 2023-11-03 ENCOUNTER — Ambulatory Visit
Admission: EM | Admit: 2023-11-03 | Discharge: 2023-11-03 | Disposition: A | Discharge status: Home / Self Care | Payer: BLUE CROSS/BLUE SHIELD | Attending: Nurse Practitioner | Admitting: Nurse Practitioner

## 2023-11-03 DIAGNOSIS — S46811A Strain of other muscles, fascia and tendons at shoulder and upper arm level, right arm, initial encounter: Secondary | ICD-10-CM | POA: Diagnosis not present

## 2023-11-03 MED ORDER — KETOROLAC TROMETHAMINE 30 MG/ML IJ SOLN
30.0000 mg | Freq: Once | INTRAMUSCULAR | Status: AC
  Administered 2023-11-03: 30 mg via INTRAMUSCULAR

## 2023-11-03 MED ORDER — TIZANIDINE HCL 4 MG PO TABS: 4.0000 mg | ORAL_TABLET | Freq: Three times a day (TID) | ORAL | 0 refills | Status: DC | PRN

## 2023-11-03 MED ORDER — IBUPROFEN 600 MG PO TABS: 600.0000 mg | ORAL_TABLET | Freq: Four times a day (QID) | ORAL | 0 refills | Status: AC | PRN

## 2023-11-03 NOTE — Discharge Instructions (Signed)
As we discussed, it sounds like you may have strained your trapezius muscle.  We have given you an injection of Toradol today to help with pain.  You can take Tylenol 500 to 1000 mg every 6 hours for pain.  You can also take the tizanidine every 8 hours as needed for muscular pain.  Starting Sunday, you can take the ibuprofen I sent to the pharmacy to help with inflammation.  Recommend light range of motion/retching exercises and ice/heat to the area to help with pain and inflammation.  Follow-up with primary care about her symptoms persist or worsen despite treatment.

## 2023-11-03 NOTE — ED Provider Notes (Signed)
[facility]    CSN: [id number] Arrival date & time: 11/03/23  1024      History   Chief Complaint Chief Complaint  Patient presents with   Shoulder Pain    HPI Dan Mathews is a 37 y.o. male.   Patient presents today with 2-week history of right shoulder/neck pain.  Denies recent fall, trauma, or injury to the area.  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  He has been using a topical over-the-counter gel without much benefit.  No swelling, bruising, or redness to the area.  No numbness or tingling shooting down the arm or in the fingertips.  No decreased range of motion of the neck.  No weakness of the upper extremities.    Past Medical History:  Diagnosis Date   Seizures (HCC)    Seizures (HCC)     There are no problems to display for this patient.   Past Surgical History:  Procedure Laterality Date   DENTAL SURGERY         Home Medications    Prior to Admission medications   Medication Sig Start Date End Date Taking? Authorizing Provider  ibuprofen (ADVIL) 600 MG tablet Take 1 tablet (600 mg total) by mouth every 6 (six) hours as needed. Take with food to prevent GI upset 11/03/23  Yes Cathlean Marseilles A, NP  tiZANidine (ZANAFLEX) 4 MG tablet Take 1 tablet (4 mg total) by mouth every 8 (eight) hours as needed for muscle spasms. Do not take with alcohol or while driving or operating heavy machinery.  May cause drowsiness. 11/03/23  Yes Valentino Nose, NP  Lacosamide 100 MG TABS Take 1 tablet (100 mg total) by mouth 2 (two) times daily. 10/20/23   Van Clines, MD  levETIRAcetam (KEPPRA) 1000 MG tablet Take 1 and 1/2 tablets twice a day 10/20/23   Van Clines, MD  fluticasone [facility]) 50 MCG/ACT nasal spray Place 2 sprays into both nostrils daily. 09/26/15 04/24/20  Horton, Mayer Masker, MD  loratadine (CLARITIN) 10 MG tablet Take 1 tablet (10 mg total) by  mouth daily. 09/26/15 04/24/20  Horton, Mayer Masker, MD    Family History Family History  Problem Relation Age of Onset   Hypertension Mother    Healthy Father     Social History Social History   Tobacco Use   Smoking status: Never   Smokeless tobacco: Never  Vaping Use   Vaping status: Never Used  Substance Use Topics   Alcohol use: No   Drug use: No     Allergies   Benadryl [diphenhydramine]   Review of Systems Review of Systems Per HPI  Physical Exam Triage Vital Signs ED Triage Vitals  Encounter Vitals Group     BP 11/03/23 1056 109/71     Systolic BP Percentile --      Diastolic BP Percentile --      Pulse Rate 11/03/23 1056 66     Resp 11/03/23 1056 18     Temp 11/03/23 1056 97.7 F (36.5 C)     Temp Source 11/03/23 1056 Oral     SpO2 11/03/23 1056 97 %     Weight --      Height --      Head Circumference --      Peak Flow --      Pain Score 11/03/23 1057 7     Pain Loc --  Pain Education --      Exclude from Growth Chart --    No data found.  Updated Vital Signs BP 109/71 (BP Location: Right Arm)   Pulse 66   Temp 97.7 F (36.5 C) (Oral)   Resp 18   SpO2 97%   Visual Acuity Right Eye Distance:   Left Eye Distance:   Bilateral Distance:    Right Eye Near:   Left Eye Near:    Bilateral Near:     Physical Exam Vitals and nursing note reviewed.  Constitutional:      General: He is not in acute distress.    Appearance: Normal appearance. He is not toxic-appearing.  Neck:      Comments: Inspection: no swelling, bruising, obvious deformity or redness to right trapezius Palpation: tender to palpation right trapezius; no obvious deformities palpated ROM: Full ROM to neck, bilateral upper extremities Strength: 5/5 bilateral upper extremities and neck Neurovascular: neurovascularly intact in bilateral upper extremities  Pulmonary:     Effort: Pulmonary effort is normal. No respiratory distress.  Musculoskeletal:     Right lower leg:  No edema.     Left lower leg: No edema.  Skin:    General: Skin is warm and dry.     Capillary Refill: Capillary refill takes less than 2 seconds.     Coloration: Skin is not jaundiced or pale.     Findings: No erythema.  Neurological:     Mental Status: He is alert and oriented to person, place, and time.  Psychiatric:        Behavior: Behavior is cooperative.      UC Treatments / Results  Labs (all labs ordered are listed, but only abnormal results are displayed) Labs Reviewed - No data to display  EKG   Radiology No results found.  Procedures Procedures (including critical care time)  Medications Ordered in UC Medications  ketorolac (TORADOL) 30 MG/ML injection 30 mg (30 mg Intramuscular Given 11/03/23 1114)    Initial Impression / Assessment and Plan / UC Course  I have reviewed the triage vital signs and the nursing notes.  Pertinent labs & imaging results that were available during my care of the patient were reviewed by me and considered in my medical decision making (see chart for details).   Patient is well-appearing, normotensive, afebrile, not tachycardic, not tachypneic, oxygenating well on room air.    1. Strain of right trapezius muscle, initial encounter Vitals and examination today are reassuring Pain treated with Toradol 30 mg IM in urgent care, avoid NSAIDs for 48 hours, then alternate Tylenol and NSAIDs Can also use muscle relaxant as needed for muscular pain Recommended light range of motion/twitching exercises and ice/heat to the area to help with pain Recommended follow-up with primary care provider symptoms persist despite treatment  The patient was given the opportunity to ask questions.  All questions answered to their satisfaction.  The patient is in agreement to this plan.    Final Clinical Impressions(s) / UC Diagnoses   Final diagnoses:  Strain of right trapezius muscle, initial encounter     Discharge Instructions      As we  discussed, it sounds like you may have strained your trapezius muscle.  We have given you an injection of Toradol today to help with pain.  You can take Tylenol 500 to 1000 mg every 6 hours for pain.  You can also take the tizanidine every 8 hours as needed for muscular pain.  Starting Sunday, you  can take the ibuprofen I sent to the pharmacy to help with inflammation.  Recommend light range of motion/retching exercises and ice/heat to the area to help with pain and inflammation.  Follow-up with primary care about her symptoms persist or worsen despite treatment.    ED Prescriptions     Medication Sig Dispense Auth. Provider   tiZANidine (ZANAFLEX) 4 MG tablet Take 1 tablet (4 mg total) by mouth every 8 (eight) hours as needed for muscle spasms. Do not take with alcohol or while driving or operating heavy machinery.  May cause drowsiness. 30 tablet Cathlean Marseilles A, NP   ibuprofen (ADVIL) 600 MG tablet Take 1 tablet (600 mg total) by mouth every 6 (six) hours as needed. Take with food to prevent GI upset 30 tablet Valentino Nose, NP      PDMP not reviewed this encounter.   Valentino Nose, NP 11/03/23 1123

## 2023-11-03 NOTE — ED Triage Notes (Signed)
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

## 2024-01-08 DIAGNOSIS — G40909 Epilepsy, unspecified, not intractable, without status epilepticus: Secondary | ICD-10-CM | POA: Diagnosis not present

## 2024-01-08 DIAGNOSIS — Z6821 Body mass index (BMI) 21.0-21.9, adult: Secondary | ICD-10-CM | POA: Diagnosis not present

## 2024-01-08 DIAGNOSIS — D72819 Decreased white blood cell count, unspecified: Secondary | ICD-10-CM | POA: Diagnosis not present

## 2024-01-31 DIAGNOSIS — R6889 Other general symptoms and signs: Secondary | ICD-10-CM | POA: Diagnosis not present

## 2024-01-31 DIAGNOSIS — G40909 Epilepsy, unspecified, not intractable, without status epilepticus: Secondary | ICD-10-CM | POA: Diagnosis not present

## 2024-01-31 DIAGNOSIS — Z6821 Body mass index (BMI) 21.0-21.9, adult: Secondary | ICD-10-CM | POA: Diagnosis not present

## 2024-01-31 DIAGNOSIS — Z20828 Contact with and (suspected) exposure to other viral communicable diseases: Secondary | ICD-10-CM | POA: Diagnosis not present

## 2024-01-31 DIAGNOSIS — J069 Acute upper respiratory infection, unspecified: Secondary | ICD-10-CM | POA: Diagnosis not present

## 2024-03-05 DIAGNOSIS — R6889 Other general symptoms and signs: Secondary | ICD-10-CM | POA: Diagnosis not present

## 2024-03-05 DIAGNOSIS — G40909 Epilepsy, unspecified, not intractable, without status epilepticus: Secondary | ICD-10-CM | POA: Diagnosis not present

## 2024-03-05 DIAGNOSIS — J329 Chronic sinusitis, unspecified: Secondary | ICD-10-CM | POA: Diagnosis not present

## 2024-03-05 DIAGNOSIS — Z6821 Body mass index (BMI) 21.0-21.9, adult: Secondary | ICD-10-CM | POA: Diagnosis not present

## 2024-03-05 DIAGNOSIS — J209 Acute bronchitis, unspecified: Secondary | ICD-10-CM | POA: Diagnosis not present

## 2024-04-26 ENCOUNTER — Other Ambulatory Visit: Payer: Self-pay | Admitting: Neurology

## 2024-04-29 ENCOUNTER — Other Ambulatory Visit: Payer: Self-pay

## 2024-04-29 ENCOUNTER — Other Ambulatory Visit: Payer: Self-pay | Admitting: Neurology

## 2024-04-29 MED ORDER — LACOSAMIDE 100 MG PO TABS: 1.0000 | ORAL_TABLET | Freq: Two times a day (BID) | ORAL | 3 refills | Status: DC

## 2024-04-29 NOTE — Telephone Encounter (Signed)
 This is pending for Ty Gales already to be signed

## 2024-04-29 NOTE — Telephone Encounter (Signed)
 Pt needs a refill on the lacosamide  100 mg  sent in to the [facility] in [place]

## 2024-07-30 ENCOUNTER — Ambulatory Visit: Payer: Self-pay

## 2024-07-30 ENCOUNTER — Encounter: Payer: Self-pay | Admitting: Emergency Medicine

## 2024-07-30 ENCOUNTER — Other Ambulatory Visit: Payer: Self-pay

## 2024-07-30 ENCOUNTER — Ambulatory Visit
Admission: EM | Admit: 2024-07-30 | Discharge: 2024-07-30 | Disposition: A | Discharge status: Home / Self Care | Attending: Family Medicine | Admitting: Family Medicine

## 2024-07-30 DIAGNOSIS — H65191 Other acute nonsuppurative otitis media, right ear: Secondary | ICD-10-CM

## 2024-07-30 DIAGNOSIS — J3489 Other specified disorders of nose and nasal sinuses: Secondary | ICD-10-CM | POA: Diagnosis not present

## 2024-07-30 DIAGNOSIS — K136 Irritative hyperplasia of oral mucosa: Secondary | ICD-10-CM

## 2024-07-30 MED ORDER — AZELASTINE HCL 0.1 % NA SOLN: 1.0000 | Freq: Two times a day (BID) | NASAL | 0 refills | Status: AC

## 2024-07-30 MED ORDER — PSEUDOEPHEDRINE HCL 60 MG PO TABS: 60.0000 mg | ORAL_TABLET | Freq: Three times a day (TID) | ORAL | 0 refills | Status: DC | PRN

## 2024-07-30 NOTE — Telephone Encounter (Signed)
 Pt states that he went to UC and was given a dx for the s/s he was calling for. Denies any further concerns/questions at this time. Advised to call back if needed.

## 2024-07-30 NOTE — ED Triage Notes (Signed)
 Pt reports nasal congestion, right ear pain, right jaw pain since Friday.

## 2024-07-30 NOTE — Telephone Encounter (Signed)
 RN attempted contact x 2, LVM to return call to discuss symptoms of possible sinus/ear infection. Will attempt contact at a later time.

## 2024-07-30 NOTE — Telephone Encounter (Signed)
 Possible sinus and ear infection- right side, under right eye is swollen- [phone]

## 2024-07-30 NOTE — ED Provider Notes (Signed)
 [facility]    CSN: [id number] Arrival date & time: 07/30/24  1559      History   Chief Complaint Chief Complaint  Patient presents with   Nasal Congestion    HPI Dan Mathews is a 38 y.o. male.   Patient presenting today with 5-day history of nasal congestion, right sided sinus pressure, right ear pain and pressure, right jaw aching.  Denies fever, chills, cough, sore throat, chest pain, shortness of breath, abdominal pain, vomiting, diarrhea.  So far trying Tylenol  with minimal relief and keeping cotton over the right ear.     Past Medical History:  Diagnosis Date   Seizures (HCC)    Seizures (HCC)     There are no active problems to display for this patient.   Past Surgical History:  Procedure Laterality Date   DENTAL SURGERY         Home Medications    Prior to Admission medications   Medication Sig Start Date End Date Taking? Authorizing Provider  azelastine  (ASTELIN ) 0.1 % nasal spray Place 1 spray into both nostrils 2 (two) times daily. Use in each nostril as directed 07/30/24  Yes Stuart Vernell Norris, PA-C  pseudoephedrine  (SUDAFED) 60 MG tablet Take 1 tablet (60 mg total) by mouth every 8 (eight) hours as needed for congestion. 07/30/24  Yes Stuart Vernell Norris, PA-C  ibuprofen  (ADVIL ) 600 MG tablet Take 1 tablet (600 mg total) by mouth every 6 (six) hours as needed. Take with food to prevent GI upset 11/03/23   Chandra Harlene LABOR, NP  Lacosamide  100 MG TABS Take 1 tablet (100 mg total) by mouth 2 (two) times daily. 04/29/24   Georjean Darice HERO, MD  levETIRAcetam  (KEPPRA ) 1000 MG tablet Take 1 and 1/2 tablets twice a day 10/20/23   Georjean Darice HERO, MD  tiZANidine  (ZANAFLEX ) 4 MG tablet Take 1 tablet (4 mg total) by mouth every 8 (eight) hours as needed for muscle spasms. Do not take with alcohol or while driving or operating heavy machinery.  May cause drowsiness. 11/03/23   Chandra Harlene LABOR, NP  fluticasone  (FLONASE ) 50 MCG/ACT nasal spray  Place 2 sprays into both nostrils daily. 09/26/15 04/24/20  Horton, Charmaine FALCON, MD  loratadine  (CLARITIN ) 10 MG tablet Take 1 tablet (10 mg total) by mouth daily. 09/26/15 04/24/20  Horton, Charmaine FALCON, MD    Family History Family History  Problem Relation Age of Onset   Hypertension Mother    Healthy Father     Social History Social History   Tobacco Use   Smoking status: Never   Smokeless tobacco: Never  Vaping Use   Vaping status: Never Used  Substance Use Topics   Alcohol use: No   Drug use: No     Allergies   Advil  [ibuprofen ] and Benadryl [diphenhydramine]   Review of Systems Review of Systems Per HPI  Physical Exam Triage Vital Signs ED Triage Vitals  Encounter Vitals Group     BP 07/30/24 1607 106/72     Girls Systolic BP Percentile --      Girls Diastolic BP Percentile --      Boys Systolic BP Percentile --      Boys Diastolic BP Percentile --      Pulse Rate 07/30/24 1607 63     Resp 07/30/24 1607 20     Temp 07/30/24 1607 98 F (36.7 C)     Temp Source 07/30/24 1607 Oral     SpO2 07/30/24 1607 95 %  Weight --      Height --      Head Circumference --      Peak Flow --      Pain Score 07/30/24 1605 3     Pain Loc --      Pain Education --      Exclude from Growth Chart --    No data found.  Updated Vital Signs BP 106/72 (BP Location: Right Arm)   Pulse 63   Temp 98 F (36.7 C) (Oral)   Resp 20   SpO2 95%   Visual Acuity Right Eye Distance:   Left Eye Distance:   Bilateral Distance:    Right Eye Near:   Left Eye Near:    Bilateral Near:     Physical Exam Vitals and nursing note reviewed.  Constitutional:      Appearance: He is well-developed.  HENT:     Head: Atraumatic.     Right Ear: External ear normal.     Left Ear: External ear normal.     Ears:     Comments: Right middle ear effusion    Nose: Rhinorrhea present.     Mouth/Throat:     Pharynx: No oropharyngeal exudate or posterior oropharyngeal erythema.      Comments: Small area of inflamed palate with no appreciable masses, lesions Eyes:     Conjunctiva/sclera: Conjunctivae normal.     Pupils: Pupils are equal, round, and reactive to light.  Cardiovascular:     Rate and Rhythm: Normal rate and regular rhythm.  Pulmonary:     Effort: Pulmonary effort is normal. No respiratory distress.     Breath sounds: No wheezing or rales.  Musculoskeletal:        General: Normal range of motion.     Cervical back: Normal range of motion and neck supple.  Lymphadenopathy:     Cervical: No cervical adenopathy.  Skin:    General: Skin is warm and dry.  Neurological:     Mental Status: He is alert and oriented to person, place, and time.  Psychiatric:        Behavior: Behavior normal.    UC Treatments / Results  Labs (all labs ordered are listed, but only abnormal results are displayed) Labs Reviewed - No data to display  EKG   Radiology No results found.  Procedures Procedures (including critical care time)  Medications Ordered in UC Medications - No data to display  Initial Impression / Assessment and Plan / UC Course  I have reviewed the triage vital signs and the nursing notes.  Pertinent labs & imaging results that were available during my care of the patient were reviewed by me and considered in my medical decision making (see chart for details).     No evidence of bacterial infection, suspect middle ear effusion causing the radiating pain and pressure.  Treat with Astelin  nasal spray, Sudafed, supportive over-the-counter medications and home care.  Salt water gargles for the oral irritation which appear to be some palate inflammation.  No appreciable abscesses or lacerations to the area. Final Clinical Impressions(s) / UC Diagnoses   Final diagnoses:  Sinus pressure  Irritation of oral cavity  Acute MEE (middle ear effusion), right   Discharge Instructions   None    ED Prescriptions     Medication Sig Dispense Auth.  Provider   azelastine  (ASTELIN ) 0.1 % nasal spray Place 1 spray into both nostrils 2 (two) times daily. Use in each nostril as directed 30 mL  Stuart Vernell Norris, PA-C   pseudoephedrine  (SUDAFED) 60 MG tablet Take 1 tablet (60 mg total) by mouth every 8 (eight) hours as needed for congestion. 15 tablet Stuart Vernell Norris, [place]      PDMP not reviewed this encounter.   Stuart Vernell Norris, [place] 07/30/24 [id number]

## 2024-08-03 ENCOUNTER — Encounter (HOSPITAL_COMMUNITY): Payer: Self-pay

## 2024-08-03 ENCOUNTER — Emergency Department (HOSPITAL_COMMUNITY)
Admission: EM | Admit: 2024-08-03 | Discharge: 2024-08-03 | Disposition: A | Discharge status: Home / Self Care | Attending: Emergency Medicine | Admitting: Emergency Medicine

## 2024-08-03 DIAGNOSIS — J0100 Acute maxillary sinusitis, unspecified: Secondary | ICD-10-CM | POA: Diagnosis not present

## 2024-08-03 DIAGNOSIS — R519 Headache, unspecified: Secondary | ICD-10-CM | POA: Diagnosis not present

## 2024-08-03 MED ORDER — AMOXICILLIN-POT CLAVULANATE 875-125 MG PO TABS
1.0000 | ORAL_TABLET | Freq: Once | ORAL | Status: AC
  Administered 2024-08-03: 1 via ORAL
  Filled 2024-08-03: qty 1

## 2024-08-03 MED ORDER — AMOXICILLIN-POT CLAVULANATE 875-125 MG PO TABS: 1.0000 | ORAL_TABLET | Freq: Two times a day (BID) | ORAL | 0 refills | Status: DC

## 2024-08-03 NOTE — ED Notes (Signed)
 ..  The patient is A&OX4, ambulatory at d/c with independent steady gait, NAD. Pt verbalized understanding of d/c instructions, prescription and follow up care.

## 2024-08-03 NOTE — ED Triage Notes (Signed)
 Pt comes in for facial swelling. Swelling been going on for the past week. Pt went to urgent care on Tuesday. Urgent care said it was some swelling and fluid in the sinuses. Pt was given a nasal spray (unknown name) and has not gotten any better. Pt can hardly eat due to the swelling. Pt can swallow with no issues. A&Ox4.

## 2024-08-03 NOTE — ED Provider Notes (Signed)
 [place] [facility] AT [facility] Provider Note   CSN: [id number] Arrival date & time: 08/03/24  8078     Patient presents with: Facial Swelling   Dan Mathews is a 38 y.o. male.  He is here with complaint of right-sided facial pain and swelling has been going on for a few days.  He went to urgent care and they told him he had a sinus infection and put him on steroid nasal spray.  Is not really improving much.  Hurts when he eats chewing on that side.  No significant nasal discharge or fever.  No difficulty swallowing.  {Add pertinent medical, surgical, social history, OB history to YEP:67052} The history is provided by the patient.  URI Presenting symptoms: facial pain   Presenting symptoms: no fever   Severity:  Moderate Onset quality:  Gradual Duration:  3 days Timing:  Constant Progression:  Worsening Chronicity:  New Relieved by:  Nothing Associated symptoms: sinus pain        Prior to Admission medications   Medication Sig Start Date End Date Taking? Authorizing Provider  azelastine  (ASTELIN ) 0.1 % nasal spray Place 1 spray into both nostrils 2 (two) times daily. Use in each nostril as directed 07/30/24   Stuart Vernell Norris, PA-C  ibuprofen  (ADVIL ) 600 MG tablet Take 1 tablet (600 mg total) by mouth every 6 (six) hours as needed. Take with food to prevent GI upset 11/03/23   Chandra Harlene LABOR, NP  Lacosamide  100 MG TABS Take 1 tablet (100 mg total) by mouth 2 (two) times daily. 04/29/24   Georjean Darice HERO, MD  levETIRAcetam  (KEPPRA ) 1000 MG tablet Take 1 and 1/2 tablets twice a day 10/20/23   Georjean Darice HERO, MD  pseudoephedrine  (SUDAFED) 60 MG tablet Take 1 tablet (60 mg total) by mouth every 8 (eight) hours as needed for congestion. 07/30/24   Stuart Vernell Norris, PA-C  tiZANidine  (ZANAFLEX ) 4 MG tablet Take 1 tablet (4 mg total) by mouth every 8 (eight) hours as needed for muscle spasms. Do not take with alcohol or while driving or operating  heavy machinery.  May cause drowsiness. 11/03/23   Chandra Harlene LABOR, NP  fluticasone  (FLONASE ) 50 MCG/ACT nasal spray Place 2 sprays into both nostrils daily. 09/26/15 04/24/20  Horton, Charmaine FALCON, MD  loratadine  (CLARITIN ) 10 MG tablet Take 1 tablet (10 mg total) by mouth daily. 09/26/15 04/24/20  Horton, Charmaine FALCON, MD    Allergies: Advil  [ibuprofen ] and Benadryl [diphenhydramine]    Review of Systems  Constitutional:  Negative for fever.  HENT:  Positive for facial swelling and sinus pain.   Respiratory:  Negative for shortness of breath.     Updated Vital Signs BP (!) 135/112 (BP Location: Right Arm)   Pulse 65   Temp 98.7 F (37.1 C) (Oral)   Resp 18   Ht 6' (1.829 m)   Wt 68 kg   SpO2 99%   BMI 20.34 kg/m   Physical Exam Vitals and nursing note reviewed.  Constitutional:      Appearance: Normal appearance. He is well-developed.  HENT:     Head: Normocephalic and atraumatic.     Right Ear: Tympanic membrane and ear canal normal.     Left Ear: Tympanic membrane and ear canal normal.     Nose:     Right Sinus: Maxillary sinus tenderness and frontal sinus tenderness present.     Mouth/Throat:     Mouth: Mucous membranes are moist.  Comments: Patient has fair dentition, no significant gingival erythema or discrete abscess identified Eyes:     Conjunctiva/sclera: Conjunctivae normal.  Pulmonary:     Effort: Pulmonary effort is normal.  Musculoskeletal:     Cervical back: Neck supple.  Skin:    General: Skin is warm and dry.  Neurological:     Mental Status: He is alert.     GCS: GCS eye subscore is 4. GCS verbal subscore is 5. GCS motor subscore is 6.     (all labs ordered are listed, but only abnormal results are displayed) Labs Reviewed - No data to display  EKG: None  Radiology: No results found.  {Document cardiac monitor, telemetry assessment procedure when appropriate:32947} Procedures   Medications Ordered in the ED - No data to display     {Click here for ABCD2, HEART and other calculators REFRESH Note before signing:1}                              Medical Decision Making  This patient complains of ***; this involves an extensive number of treatment Options and is a complaint that carries with it a high risk of complications and morbidity. The differential includes ***  I ordered, reviewed and interpreted labs, which included *** I ordered medication *** and reviewed PMP when indicated. I ordered imaging studies which included *** and I independently    visualized and interpreted imaging which showed *** Additional history obtained from *** Previous records obtained and reviewed *** I consulted *** and discussed lab and imaging findings and discussed disposition.  Cardiac monitoring reviewed, *** Social determinants considered, *** Critical Interventions: ***  After the interventions stated above, I reevaluated the patient and found *** Admission and further testing considered, ***   {Document critical care time when appropriate  Document review of labs and clinical decision tools ie CHADS2VASC2, etc  Document your independent review of radiology images and any outside records  Document your discussion with family members, caretakers and with consultants  Document social determinants of health affecting pt's care  Document your decision making why or why not admission, treatments were needed:32947:::1}   Final diagnoses:  None    ED Discharge Orders     None

## 2024-08-03 NOTE — Discharge Instructions (Signed)
 Continue to use ibuprofen  and nasal spray.  We are starting you on antibiotics.  Return to the emergency department if any worsening or concerning symptoms.

## 2024-08-15 ENCOUNTER — Encounter: Payer: Self-pay | Admitting: Internal Medicine

## 2024-08-15 ENCOUNTER — Ambulatory Visit (INDEPENDENT_AMBULATORY_CARE_PROVIDER_SITE_OTHER): Admitting: Internal Medicine

## 2024-08-15 VITALS — BP 100/69 | HR 60 | Ht 72.0 in | Wt 150.0 lb

## 2024-08-15 DIAGNOSIS — Z114 Encounter for screening for human immunodeficiency virus [HIV]: Secondary | ICD-10-CM | POA: Diagnosis not present

## 2024-08-15 DIAGNOSIS — Z0001 Encounter for general adult medical examination with abnormal findings: Secondary | ICD-10-CM

## 2024-08-15 DIAGNOSIS — E559 Vitamin D deficiency, unspecified: Secondary | ICD-10-CM

## 2024-08-15 DIAGNOSIS — E782 Mixed hyperlipidemia: Secondary | ICD-10-CM | POA: Diagnosis not present

## 2024-08-15 DIAGNOSIS — Z23 Encounter for immunization: Secondary | ICD-10-CM | POA: Diagnosis not present

## 2024-08-15 DIAGNOSIS — Z1159 Encounter for screening for other viral diseases: Secondary | ICD-10-CM

## 2024-08-15 DIAGNOSIS — G40009 Localization-related (focal) (partial) idiopathic epilepsy and epileptic syndromes with seizures of localized onset, not intractable, without status epilepticus: Secondary | ICD-10-CM

## 2024-08-15 NOTE — Assessment & Plan Note (Signed)
 Last lipid profile showed elevated LDL - 115 Advised to follow low cholesterol diet

## 2024-08-15 NOTE — Patient Instructions (Signed)
 Please continue to take medications as prescribed.  Please continue to follow heart healthy diet and perform moderate exercise/walking at least 150 mins/week.

## 2024-08-15 NOTE — Assessment & Plan Note (Signed)
 On Keppra  1500 mg BID and Vimpat  100 mg BID Followed by Neurology

## 2024-08-15 NOTE — Progress Notes (Signed)
 New Patient Office Visit  Subjective:  Patient ID: Dan Mathews, male    DOB: [birth year]  Age: 38 y.o. MRN: [id number]  CC:  Chief Complaint  Patient presents with   Establish Care    New patient establishing care.     HPI Dan Mathews is a 38 y.o. male with past medical history of seizure disorder who presents for establishing care.  Seizure disorder: He takes Keppra  and Vimpat  for it.  Followed by neurology.  Denies any recent seizure-like activity.  He had acute sinusitis about 10 days ago, which has improved now with Augmentin .  Denies any nasal congestion, sinus pressure, sore throat or cough currently.    Past Medical History:  Diagnosis Date   Seizures (HCC)    Seizures (HCC)     Past Surgical History:  Procedure Laterality Date   DENTAL SURGERY      Family History  Problem Relation Age of Onset   Hypertension Mother    Healthy Father     Social History   Socioeconomic History   Marital status: Single    Spouse name: Not on file   Number of children: Not on file   Years of education: Not on file   Highest education level: Not on file  Occupational History   Not on file  Tobacco Use   Smoking status: Never   Smokeless tobacco: Never  Vaping Use   Vaping status: Never Used  Substance and Sexual Activity   Alcohol use: No   Drug use: No   Sexual activity: Yes    Birth control/protection: None  Other Topics Concern   Not on file  Social History Narrative   Are you right handed or left handed? Right handed    Are you currently employed ? yes   What is your current occupation? [employer]    Do you live at home alone? no   Who lives with you?  Mother and grandmother    What type of home do you live in: 1 story or 2 story? 2 story        Social Drivers of Corporate investment banker Strain: Not on file  Food Insecurity: Not on file  Transportation Needs: Not on file  Physical Activity: Not on file  Stress: Not on file  Social  Connections: Not on file  Intimate Partner Violence: Not on file    ROS Review of Systems  Constitutional:  Negative for chills and fever.  HENT:  Negative for congestion and sore throat.   Eyes:  Negative for pain and discharge.  Respiratory:  Negative for cough and shortness of breath.   Cardiovascular:  Negative for chest pain and palpitations.  Gastrointestinal:  Negative for diarrhea, nausea and vomiting.  Endocrine: Negative for polydipsia and polyuria.  Genitourinary:  Negative for dysuria and hematuria.  Musculoskeletal:  Negative for neck pain and neck stiffness.  Skin:  Negative for rash.  Neurological:  Negative for dizziness, weakness, numbness and headaches.  Psychiatric/Behavioral:  Negative for agitation and behavioral problems.     Objective:   Today's Vitals: BP 100/69   Pulse 60   Ht 6' (1.829 m)   Wt 150 lb (68 kg)   SpO2 99%   BMI 20.34 kg/m   Physical Exam Vitals reviewed.  Constitutional:      General: He is not in acute distress.    Appearance: He is not diaphoretic.  HENT:     Head: Normocephalic and atraumatic.  Nose: Nose normal.     Mouth/Throat:     Mouth: Mucous membranes are moist.  Eyes:     General: No scleral icterus.    Extraocular Movements: Extraocular movements intact.  Cardiovascular:     Rate and Rhythm: Normal rate and regular rhythm.     Heart sounds: Normal heart sounds. No murmur heard. Pulmonary:     Breath sounds: Normal breath sounds. No wheezing or rales.  Abdominal:     Palpations: Abdomen is soft.     Tenderness: There is no abdominal tenderness.  Musculoskeletal:     Cervical back: Neck supple. No tenderness.     Right lower leg: No edema.     Left lower leg: No edema.  Skin:    General: Skin is warm.     Findings: No rash.  Neurological:     General: No focal deficit present.     Mental Status: He is alert and oriented to person, place, and time.     Cranial Nerves: No cranial nerve deficit.      Sensory: No sensory deficit.     Motor: No weakness.  Psychiatric:        Mood and Affect: Mood normal.        Behavior: Behavior normal.     Assessment & Plan:   Problem List Items Addressed This Visit       Nervous and Auditory   Localization-related (focal) (partial) idiopathic epilepsy and epileptic syndromes with seizures of localized onset, not intractable, without status epilepticus (HCC)   On Keppra  1500 mg BID and Vimpat  100 mg BID Followed by Neurology      Relevant Orders   TSH   CMP14+EGFR   CBC with Differential/Platelet     Other   Mixed hyperlipidemia   Last lipid profile showed elevated LDL - 115 Advised to follow low cholesterol diet      Relevant Orders   Lipid panel   Encounter for general adult medical examination with abnormal findings - Primary   Physical exam as documented. Counseling done  re healthy lifestyle involving commitment to 150 minutes exercise per week, heart healthy diet, and attaining healthy weight.The importance of adequate sleep also discussed. Immunization and cancer screening needs are specifically addressed at this visit.      Other Visit Diagnoses       Vitamin D  deficiency       Relevant Orders   VITAMIN D  25 Hydroxy (Vit-D Deficiency, Fractures)     Need for hepatitis C screening test       Relevant Orders   Hepatitis C Antibody     Screening for HIV (human immunodeficiency virus)       Relevant Orders   HIV antibody (with reflex)     Encounter for immunization       Relevant Orders   Tdap vaccine greater than or equal to 7yo IM (Completed)       Outpatient Encounter Medications as of 08/15/2024  Medication Sig   azelastine  (ASTELIN ) 0.1 % nasal spray Place 1 spray into both nostrils 2 (two) times daily. Use in each nostril as directed   ibuprofen  (ADVIL ) 600 MG tablet Take 1 tablet (600 mg total) by mouth every 6 (six) hours as needed. Take with food to prevent GI upset   Lacosamide  100 MG TABS Take 1 tablet  (100 mg total) by mouth 2 (two) times daily.   levETIRAcetam  (KEPPRA ) 1000 MG tablet Take 1 and 1/2 tablets twice a day   [  DISCONTINUED] amoxicillin -clavulanate (AUGMENTIN ) 875-125 MG tablet Take 1 tablet by mouth every 12 (twelve) hours.   [DISCONTINUED] pseudoephedrine  (SUDAFED) 60 MG tablet Take 1 tablet (60 mg total) by mouth every 8 (eight) hours as needed for congestion.   [DISCONTINUED] tiZANidine  (ZANAFLEX ) 4 MG tablet Take 1 tablet (4 mg total) by mouth every 8 (eight) hours as needed for muscle spasms. Do not take with alcohol or while driving or operating heavy machinery.  May cause drowsiness.   [DISCONTINUED] fluticasone  (FLONASE ) 50 MCG/ACT nasal spray Place 2 sprays into both nostrils daily.   [DISCONTINUED] loratadine  (CLARITIN ) 10 MG tablet Take 1 tablet (10 mg total) by mouth daily.   No facility-administered encounter medications on file as of 08/15/2024.    Follow-up: Return in about 1 year (around 08/15/2025).   Suzzane MARLA Blanch, MD

## 2024-08-15 NOTE — Assessment & Plan Note (Signed)
 Physical exam as documented. Counseling done  re healthy lifestyle involving commitment to 150 minutes exercise per week, heart healthy diet, and attaining healthy weight.The importance of adequate sleep also discussed. Immunization and cancer screening needs are specifically addressed at this visit.

## 2024-08-16 ENCOUNTER — Telehealth: Payer: Self-pay

## 2024-08-16 ENCOUNTER — Ambulatory Visit: Payer: Self-pay | Admitting: Internal Medicine

## 2024-08-16 ENCOUNTER — Other Ambulatory Visit: Payer: Self-pay | Admitting: Internal Medicine

## 2024-08-16 ENCOUNTER — Telehealth: Payer: Self-pay | Admitting: *Deleted

## 2024-08-16 DIAGNOSIS — D72819 Decreased white blood cell count, unspecified: Secondary | ICD-10-CM

## 2024-08-16 LAB — CBC WITH DIFFERENTIAL/PLATELET
Basophils Absolute: 0 x10E3/uL (ref 0.0–0.2)
Basos: 1 %
EOS (ABSOLUTE): 0.1 x10E3/uL (ref 0.0–0.4)
Eos: 4 %
Hematocrit: 43.8 % (ref 37.5–51.0)
Hemoglobin: 14.3 g/dL (ref 13.0–17.7)
Immature Grans (Abs): 0 x10E3/uL (ref 0.0–0.1)
Immature Granulocytes: 0 %
Lymphocytes Absolute: 1.3 x10E3/uL (ref 0.7–3.1)
Lymphs: 46 %
MCH: 27.3 pg (ref 26.6–33.0)
MCHC: 32.6 g/dL (ref 31.5–35.7)
MCV: 84 fL (ref 79–97)
Monocytes Absolute: 0.3 x10E3/uL (ref 0.1–0.9)
Monocytes: 10 %
Neutrophils Absolute: 1.1 x10E3/uL — ABNORMAL LOW (ref 1.4–7.0)
Neutrophils: 39 %
Platelets: 336 x10E3/uL (ref 150–450)
RBC: 5.24 x10E6/uL (ref 4.14–5.80)
RDW: 13.7 % (ref 11.6–15.4)
WBC: 2.8 x10E3/uL — ABNORMAL LOW (ref 3.4–10.8)

## 2024-08-16 LAB — LIPID PANEL
Chol/HDL Ratio: 3.1 ratio (ref 0.0–5.0)
Cholesterol, Total: 160 mg/dL (ref 100–199)
HDL: 51 mg/dL (ref >39)
LDL Chol Calc (NIH): 98 mg/dL (ref 0–99)
Triglycerides: 52 mg/dL (ref 0–149)
VLDL Cholesterol Cal: 11 mg/dL (ref 5–40)

## 2024-08-16 LAB — CMP14+EGFR
ALT: 13 IU/L (ref 0–44)
AST: 17 IU/L (ref 0–40)
Albumin: 4.7 g/dL (ref 4.1–5.1)
Alkaline Phosphatase: 97 IU/L (ref 44–121)
BUN/Creatinine Ratio: 6 — ABNORMAL LOW (ref 9–20)
BUN: 7 mg/dL (ref 6–20)
Bilirubin Total: 1 mg/dL (ref 0.0–1.2)
CO2: 26 mmol/L (ref 20–29)
Calcium: 9.7 mg/dL (ref 8.7–10.2)
Chloride: 101 mmol/L (ref 96–106)
Creatinine, Ser: 1.08 mg/dL (ref 0.76–1.27)
Globulin, Total: 2.6 g/dL (ref 1.5–4.5)
Glucose: 86 mg/dL (ref 70–99)
Potassium: 3.7 mmol/L (ref 3.5–5.2)
Sodium: 141 mmol/L (ref 134–144)
Total Protein: 7.3 g/dL (ref 6.0–8.5)
eGFR: 90 mL/min/1.73 (ref >59)

## 2024-08-16 LAB — TSH: TSH: 0.841 u[IU]/mL (ref 0.450–4.500)

## 2024-08-16 LAB — HIV ANTIBODY (ROUTINE TESTING W REFLEX): HIV Screen 4th Generation wRfx: NONREACTIVE

## 2024-08-16 LAB — HEPATITIS C ANTIBODY: Hep C Virus Ab: NONREACTIVE

## 2024-08-16 LAB — VITAMIN D 25 HYDROXY (VIT D DEFICIENCY, FRACTURES): Vit D, 25-Hydroxy: 23.8 ng/mL — ABNORMAL LOW (ref 30.0–100.0)

## 2024-08-16 NOTE — Telephone Encounter (Signed)
 pt verified no seizure last appt and last seizure in 2016

## 2024-08-16 NOTE — Telephone Encounter (Signed)
 Copied from CRM [id number]. Topic: Clinical - Lab/Test Results >> Aug 16, 2024 10:52 AM Dan Mathews wrote: Reason for CRM: Patient returning call from [place] in regards to his labs.  Patient can be reached at [phone]

## 2024-08-22 ENCOUNTER — Telehealth: Payer: Self-pay | Admitting: Internal Medicine

## 2024-08-22 DIAGNOSIS — Z0279 Encounter for issue of other medical certificate: Secondary | ICD-10-CM

## 2024-08-22 NOTE — Telephone Encounter (Signed)
 [place] DMV forms  Copied Noted Sleeved Original placed provider box Copy placed front desk folder

## 2024-08-30 ENCOUNTER — Other Ambulatory Visit: Payer: Self-pay | Admitting: Neurology

## 2024-09-03 NOTE — Telephone Encounter (Signed)
 Mother Yetta picked up the forms

## 2024-10-21 ENCOUNTER — Ambulatory Visit: Payer: BLUE CROSS/BLUE SHIELD | Admitting: Neurology

## 2024-10-28 ENCOUNTER — Other Ambulatory Visit: Payer: Self-pay | Admitting: Neurology

## 2024-11-15 DIAGNOSIS — D72819 Decreased white blood cell count, unspecified: Secondary | ICD-10-CM | POA: Diagnosis not present

## 2024-11-16 LAB — CBC WITH DIFFERENTIAL/PLATELET
Basophils Absolute: 0 x10E3/uL (ref 0.0–0.2)
Basos: 1 %
EOS (ABSOLUTE): 0.2 x10E3/uL (ref 0.0–0.4)
Eos: 5 %
Hematocrit: 42.2 % (ref 37.5–51.0)
Hemoglobin: 14.1 g/dL (ref 13.0–17.7)
Immature Grans (Abs): 0 x10E3/uL (ref 0.0–0.1)
Immature Granulocytes: 0 %
Lymphocytes Absolute: 1.4 x10E3/uL (ref 0.7–3.1)
Lymphs: 45 %
MCH: 27.2 pg (ref 26.6–33.0)
MCHC: 33.4 g/dL (ref 31.5–35.7)
MCV: 82 fL (ref 79–97)
Monocytes Absolute: 0.3 x10E3/uL (ref 0.1–0.9)
Monocytes: 9 %
Neutrophils Absolute: 1.2 x10E3/uL — ABNORMAL LOW (ref 1.4–7.0)
Neutrophils: 40 %
Platelets: 241 x10E3/uL (ref 150–450)
RBC: 5.18 x10E6/uL (ref 4.14–5.80)
RDW: 13.5 % (ref 11.6–15.4)
WBC: 3 x10E3/uL — ABNORMAL LOW (ref 3.4–10.8)

## 2024-11-18 ENCOUNTER — Encounter: Payer: Self-pay | Admitting: Neurology

## 2024-11-19 ENCOUNTER — Encounter: Payer: Self-pay | Admitting: Internal Medicine

## 2024-11-19 ENCOUNTER — Ambulatory Visit (INDEPENDENT_AMBULATORY_CARE_PROVIDER_SITE_OTHER): Admitting: Internal Medicine

## 2024-11-19 VITALS — BP 111/74 | HR 63 | Ht 72.0 in | Wt 151.4 lb

## 2024-11-19 DIAGNOSIS — L03031 Cellulitis of right toe: Secondary | ICD-10-CM | POA: Diagnosis not present

## 2024-11-19 DIAGNOSIS — B353 Tinea pedis: Secondary | ICD-10-CM | POA: Diagnosis not present

## 2024-11-19 MED ORDER — CEPHALEXIN 500 MG PO CAPS: 500.0000 mg | ORAL_CAPSULE | Freq: Three times a day (TID) | ORAL | 0 refills | Status: AC

## 2024-11-19 MED ORDER — KETOCONAZOLE 2 % EX CREA: 1.0000 | TOPICAL_CREAM | Freq: Every day | CUTANEOUS | 0 refills | Status: AC

## 2024-11-19 NOTE — Progress Notes (Signed)
 Acute Office Visit  Subjective:    Patient ID: Dan Mathews, male    DOB: [birth year], 38 y.o.   MRN: [id number]  Chief Complaint  Patient presents with   Foot Pain    Right foot pain ongoing for 1 month    HPI Patient is in today for complaint of pain in the webspace of right foot for the last 1 month.  He has cracking of the skin in the webspace area.  Pain is constant and sharp.  He reports that he had similar infection in the past, and was given antifungal cream by previous PCP.  Denies any fever or chills.  Denies any recent injury.  Denies any bleeding or local discharge.  Past Medical History:  Diagnosis Date   Seizures (HCC)    Seizures (HCC)     Past Surgical History:  Procedure Laterality Date   DENTAL SURGERY      Family History  Problem Relation Age of Onset   Hypertension Mother    Healthy Father     Social History   Socioeconomic History   Marital status: Single    Spouse name: Not on file   Number of children: Not on file   Years of education: Not on file   Highest education level: Not on file  Occupational History   Not on file  Tobacco Use   Smoking status: Never   Smokeless tobacco: Never  Vaping Use   Vaping status: Never Used  Substance and Sexual Activity   Alcohol use: No   Drug use: No   Sexual activity: Yes    Birth control/protection: None  Other Topics Concern   Not on file  Social History Narrative   Are you right handed or left handed? Right handed    Are you currently employed ? yes   What is your current occupation? [employer]    Do you live at home alone? no   Who lives with you?  Mother and grandmother    What type of home do you live in: 1 story or 2 story? 2 story        Social Drivers of Corporate Investment Banker Strain: Not on file  Food Insecurity: Not on file  Transportation Needs: Not on file  Physical Activity: Not on file  Stress: Not on file  Social Connections: Not on file  Intimate Partner Violence:  Not on file    Outpatient Medications Prior to Visit  Medication Sig Dispense Refill   azelastine  (ASTELIN ) 0.1 % nasal spray Place 1 spray into both nostrils 2 (two) times daily. Use in each nostril as directed 30 mL 0   ibuprofen  (ADVIL ) 600 MG tablet Take 1 tablet (600 mg total) by mouth every 6 (six) hours as needed. Take with food to prevent GI upset 30 tablet 0   Lacosamide  100 MG TABS Take 1 tablet by mouth twice daily 60 tablet 5   levETIRAcetam  (KEPPRA ) 1000 MG tablet TAKE 1 & 1/2 (ONE & ONE-HALF) TABLETS BY MOUTH TWICE DAILY 90 tablet 0   No facility-administered medications prior to visit.    Allergies  Allergen Reactions   Advil  [Ibuprofen ]     Due to seizure med.   Benadryl [Diphenhydramine]     Med counteracts to seizure meds     Review of Systems  Constitutional:  Negative for chills and fever.  HENT:  Negative for congestion and sore throat.   Eyes:  Negative for pain and discharge.  Respiratory:  Negative for cough and shortness of breath.   Cardiovascular:  Negative for chest pain and palpitations.  Gastrointestinal:  Negative for diarrhea, nausea and vomiting.  Endocrine: Negative for polydipsia and polyuria.  Genitourinary:  Negative for dysuria and hematuria.  Musculoskeletal:  Negative for neck pain and neck stiffness.  Skin:  Negative for rash.  Neurological:  Negative for dizziness, weakness, numbness and headaches.  Psychiatric/Behavioral:  Negative for agitation and behavioral problems.        Objective:    Physical Exam Vitals reviewed.  Constitutional:      General: He is not in acute distress.    Appearance: He is not diaphoretic.  HENT:     Head: Normocephalic and atraumatic.     Nose: Nose normal.     Mouth/Throat:     Mouth: Mucous membranes are moist.  Eyes:     General: No scleral icterus.    Extraocular Movements: Extraocular movements intact.  Cardiovascular:     Rate and Rhythm: Normal rate and regular rhythm.     Heart  sounds: Normal heart sounds. No murmur heard. Pulmonary:     Breath sounds: Normal breath sounds. No wheezing or rales.  Musculoskeletal:     Cervical back: Neck supple. No tenderness.     Right lower leg: No edema.     Left lower leg: No edema.  Feet:     Comments: Erythema in the webspace area between 4th and 5th toes of right foot Skin:    General: Skin is warm.     Findings: No rash.  Neurological:     General: No focal deficit present.     Mental Status: He is alert and oriented to person, place, and time.  Psychiatric:        Mood and Affect: Mood normal.        Behavior: Behavior normal.     BP 111/74   Pulse 63   Ht 6' (1.829 m)   Wt 151 lb 6.4 oz (68.7 kg)   SpO2 98%   BMI 20.53 kg/m  Wt Readings from Last 3 Encounters:  11/19/24 151 lb 6.4 oz (68.7 kg)  08/15/24 150 lb (68 kg)  08/03/24 150 lb (68 kg)        Assessment & Plan:   Problem List Items Addressed This Visit       Musculoskeletal and Integument   Tinea pedis of right foot - Primary   Ketoconazole  cream prescribed Advised to keep area clean and dry Advised to wear shoes most of the time to avoid local bacterial infection      Relevant Medications   ketoconazole  (NIZORAL ) 2 % cream   cephALEXin  (KEFLEX ) 500 MG capsule     Other   Cellulitis of toe of right foot   Since he has mild tenderness in the surrounding area of the webspace, concern for local cellulitis - started oral Keflex       Relevant Medications   cephALEXin  (KEFLEX ) 500 MG capsule     Meds ordered this encounter  Medications   ketoconazole  (NIZORAL ) 2 % cream    Sig: Apply 1 Application topically daily.    Dispense:  30 g    Refill:  0   cephALEXin  (KEFLEX ) 500 MG capsule    Sig: Take 1 capsule (500 mg total) by mouth 3 (three) times daily.    Dispense:  15 capsule    Refill:  0     Danijah Noh MARLA Blanch, MD

## 2024-11-19 NOTE — Assessment & Plan Note (Signed)
 Ketoconazole  cream prescribed Advised to keep area clean and dry Advised to wear shoes most of the time to avoid local bacterial infection

## 2024-11-19 NOTE — Assessment & Plan Note (Signed)
 Since he has mild tenderness in the surrounding area of the webspace, concern for local cellulitis - started oral Keflex 

## 2024-11-19 NOTE — Patient Instructions (Signed)
 Please start taking Keflex  as prescribed.  Please apply Ketoconazole  cream in the webspace between toes.

## 2024-12-12 ENCOUNTER — Other Ambulatory Visit: Payer: Self-pay | Admitting: Neurology

## 2025-05-20 ENCOUNTER — Ambulatory Visit: Admitting: Neurology

## 2025-08-18 ENCOUNTER — Ambulatory Visit: Admitting: Internal Medicine
